# Patient Record
Sex: Female | Born: 1975 | Race: White | Hispanic: No | Marital: Married | Smoking: Never smoker
Health system: Southern US, Community
[De-identification: ages and names within clinical notes are randomized; demographics above are authoritative.]

## PROBLEM LIST (undated history)

## (undated) DIAGNOSIS — M542 Cervicalgia: Secondary | ICD-10-CM

## (undated) DIAGNOSIS — I10 Essential (primary) hypertension: Secondary | ICD-10-CM

## (undated) DIAGNOSIS — E079 Disorder of thyroid, unspecified: Secondary | ICD-10-CM

## (undated) DIAGNOSIS — M549 Dorsalgia, unspecified: Secondary | ICD-10-CM

## (undated) DIAGNOSIS — M25519 Pain in unspecified shoulder: Secondary | ICD-10-CM

## (undated) DIAGNOSIS — J302 Other seasonal allergic rhinitis: Secondary | ICD-10-CM

## (undated) HISTORY — PX: ABDOMINAL HYSTERECTOMY: SHX81

## (undated) HISTORY — PX: ABDOMINAL SURGERY: SHX537

## (undated) HISTORY — PX: OTHER SURGICAL HISTORY: SHX169

## (undated) HISTORY — PX: BREAST SURGERY: SHX581

## (undated) HISTORY — PX: HAND SURGERY: SHX662

## (undated) HISTORY — PX: TONSILLECTOMY: SUR1361

---

## 2000-10-26 ENCOUNTER — Other Ambulatory Visit: Admission: RE | Admit: 2000-10-26 | Discharge: 2000-10-26 | Payer: Self-pay | Admitting: Plastic Surgery

## 2000-12-07 ENCOUNTER — Other Ambulatory Visit: Admission: RE | Admit: 2000-12-07 | Discharge: 2000-12-07 | Payer: Self-pay | Admitting: Gynecology

## 2001-02-10 ENCOUNTER — Encounter: Admission: RE | Admit: 2001-02-10 | Discharge: 2001-02-10 | Payer: Self-pay | Admitting: Otolaryngology

## 2001-02-10 ENCOUNTER — Encounter: Payer: Self-pay | Admitting: Otolaryngology

## 2001-03-15 ENCOUNTER — Other Ambulatory Visit: Admission: RE | Admit: 2001-03-15 | Discharge: 2001-03-15 | Payer: Self-pay | Admitting: Otolaryngology

## 2001-04-25 ENCOUNTER — Emergency Department (HOSPITAL_COMMUNITY): Admission: EM | Admit: 2001-04-25 | Discharge: 2001-04-26 | Payer: Self-pay | Admitting: Unknown Physician Specialty

## 2001-04-26 ENCOUNTER — Encounter: Payer: Self-pay | Admitting: Emergency Medicine

## 2001-05-04 ENCOUNTER — Encounter: Admission: RE | Admit: 2001-05-04 | Discharge: 2001-05-19 | Payer: Self-pay | Admitting: Internal Medicine

## 2001-05-20 ENCOUNTER — Encounter: Admission: RE | Admit: 2001-05-20 | Discharge: 2001-08-18 | Payer: Self-pay | Admitting: Internal Medicine

## 2003-12-05 ENCOUNTER — Inpatient Hospital Stay (HOSPITAL_COMMUNITY): Admission: AD | Admit: 2003-12-05 | Discharge: 2003-12-05 | Payer: Self-pay | Admitting: Obstetrics & Gynecology

## 2013-08-29 DIAGNOSIS — M797 Fibromyalgia: Secondary | ICD-10-CM | POA: Diagnosis present

## 2013-11-02 ENCOUNTER — Ambulatory Visit: Payer: Self-pay | Admitting: Family Medicine

## 2013-12-27 ENCOUNTER — Ambulatory Visit: Payer: Self-pay | Admitting: Family Medicine

## 2014-03-07 ENCOUNTER — Ambulatory Visit: Payer: Self-pay | Admitting: Emergency Medicine

## 2014-03-28 DIAGNOSIS — E890 Postprocedural hypothyroidism: Secondary | ICD-10-CM | POA: Diagnosis present

## 2014-07-24 ENCOUNTER — Ambulatory Visit: Payer: Self-pay

## 2014-07-31 DIAGNOSIS — Z9013 Acquired absence of bilateral breasts and nipples: Secondary | ICD-10-CM | POA: Insufficient documentation

## 2015-05-19 IMAGING — CR CERVICAL SPINE - COMPLETE 4+ VIEW
1 series · 5 of 5 positions shown · non-contrast
Comparison: None.

CLINICAL DATA: Pain post blunt trauma

EXAM:
CERVICAL SPINE  4+ VIEWS

[Series 1: lat · 0.17mm/px · 5 of 5 slices shown]
[im 1/5]
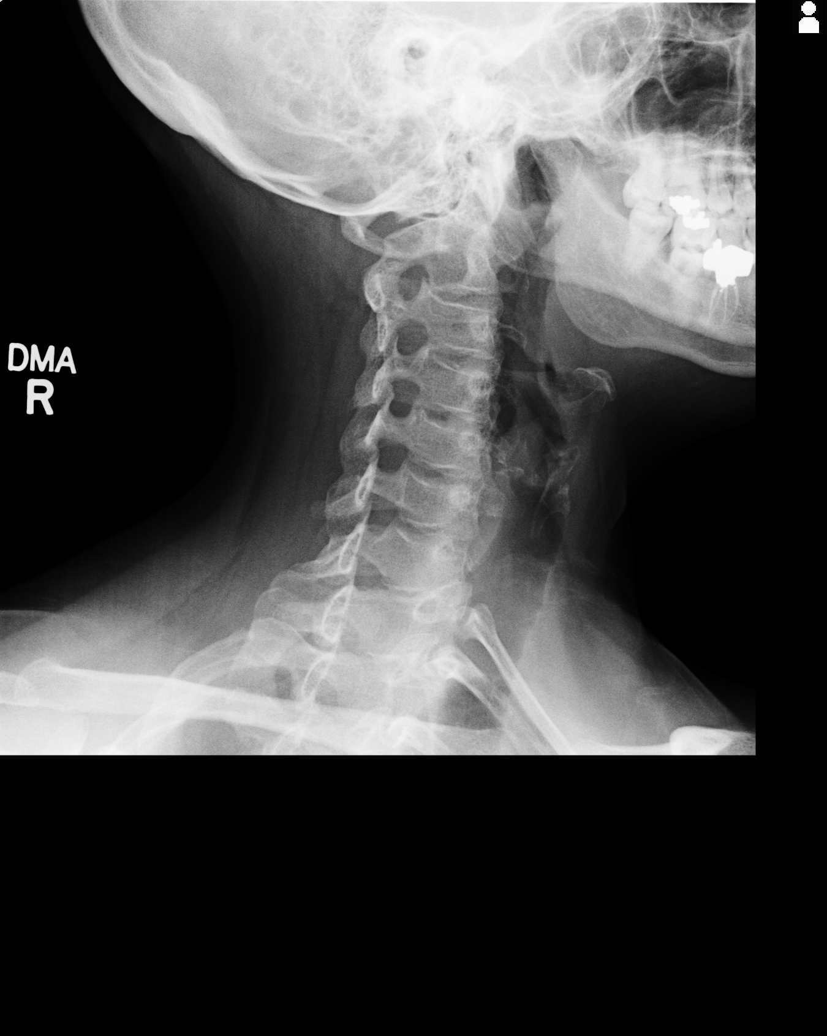
[im 2/5]
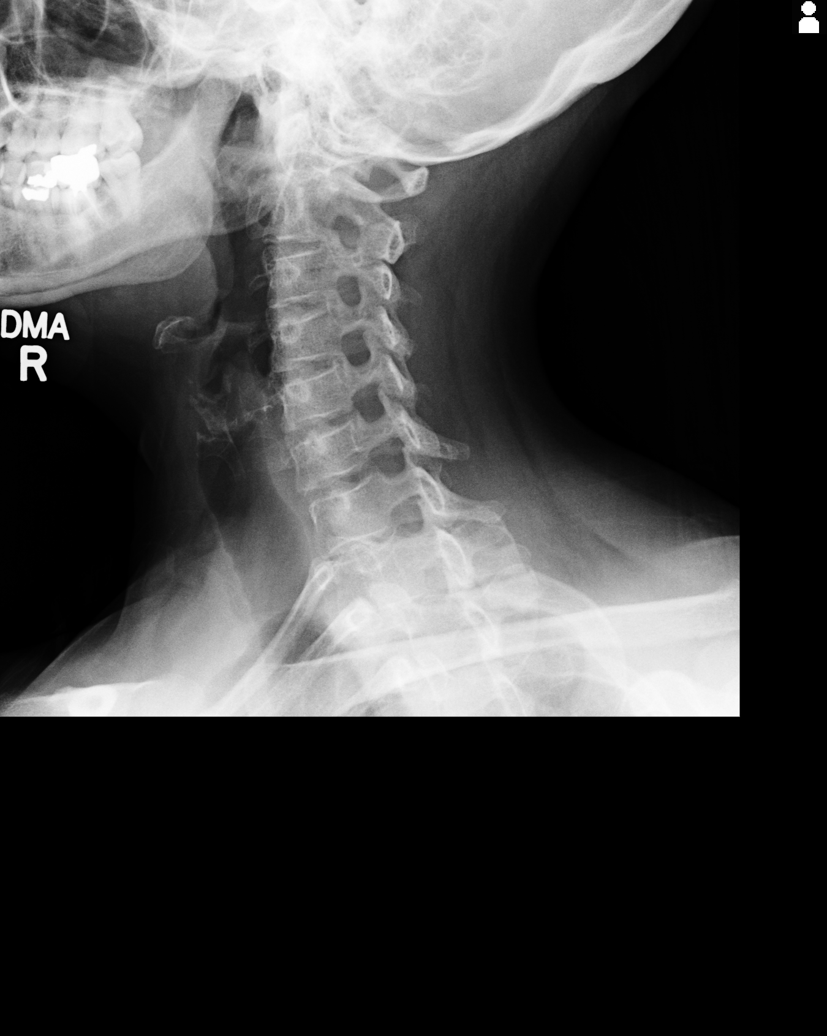
[im 3/5]
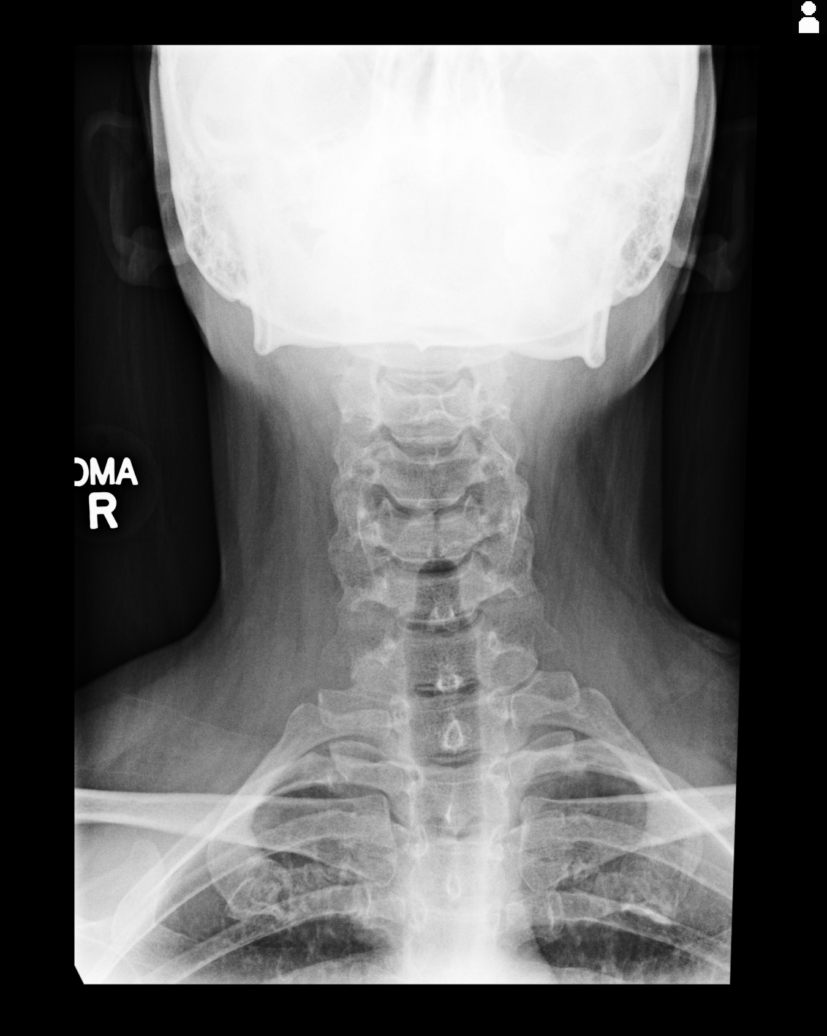
[im 4/5]
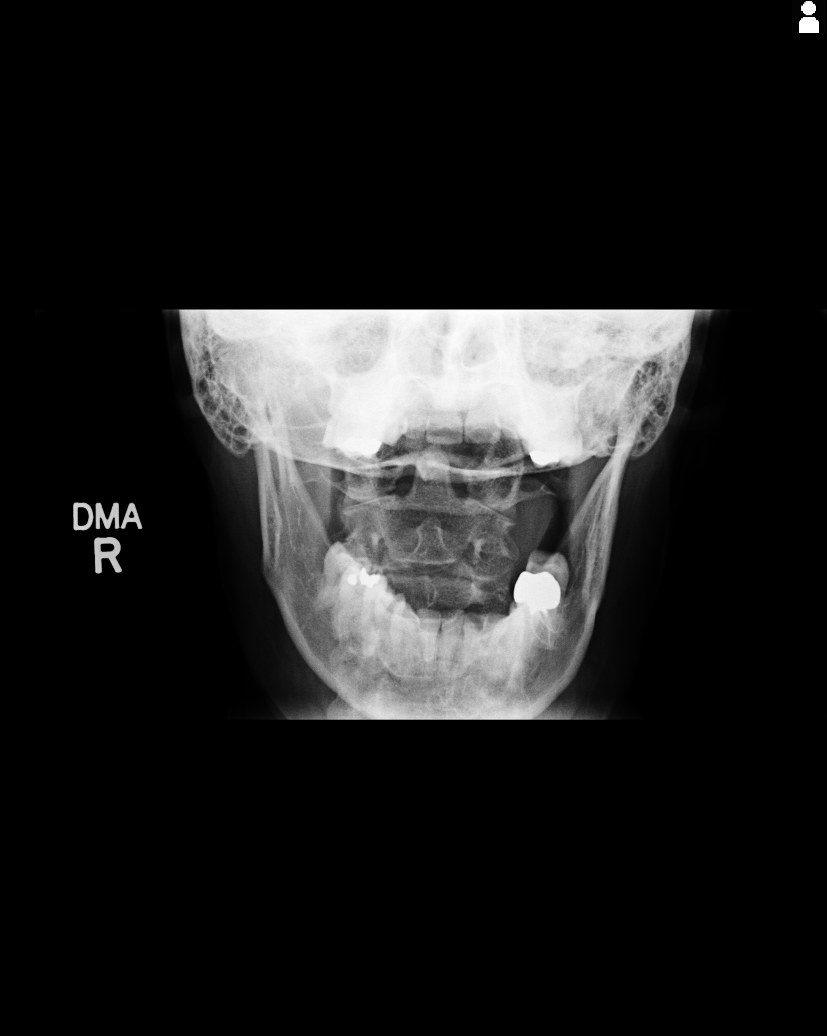
[im 5/5]
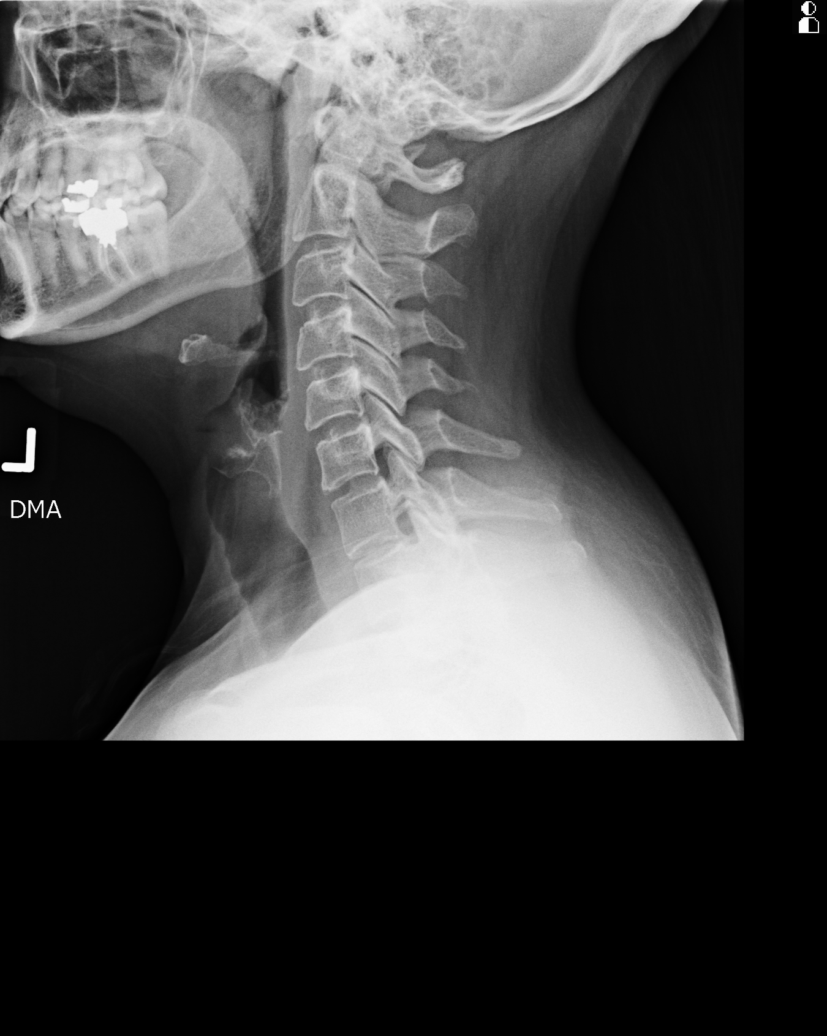

[5 of 5 positions shown; findings below may reference images not displayed]

FINDINGS: There is no evidence of cervical spine fracture or prevertebral soft
tissue swelling. Alignment is normal. No other significant bone
abnormalities are identified. Multiple dental restorations.
IMPRESSION: Negative cervical spine radiographs.

## 2015-05-19 IMAGING — CR DG THORACIC SPINE 2-3V
1 series · 3 of 3 positions shown · non-contrast
Comparison: None.

CLINICAL DATA: Pain post blunt trauma

EXAM:
THORACIC SPINE - 2 VIEW

[Series 1: ap · 0.17mm/px · 3 of 3 slices shown]
[im 1/3]
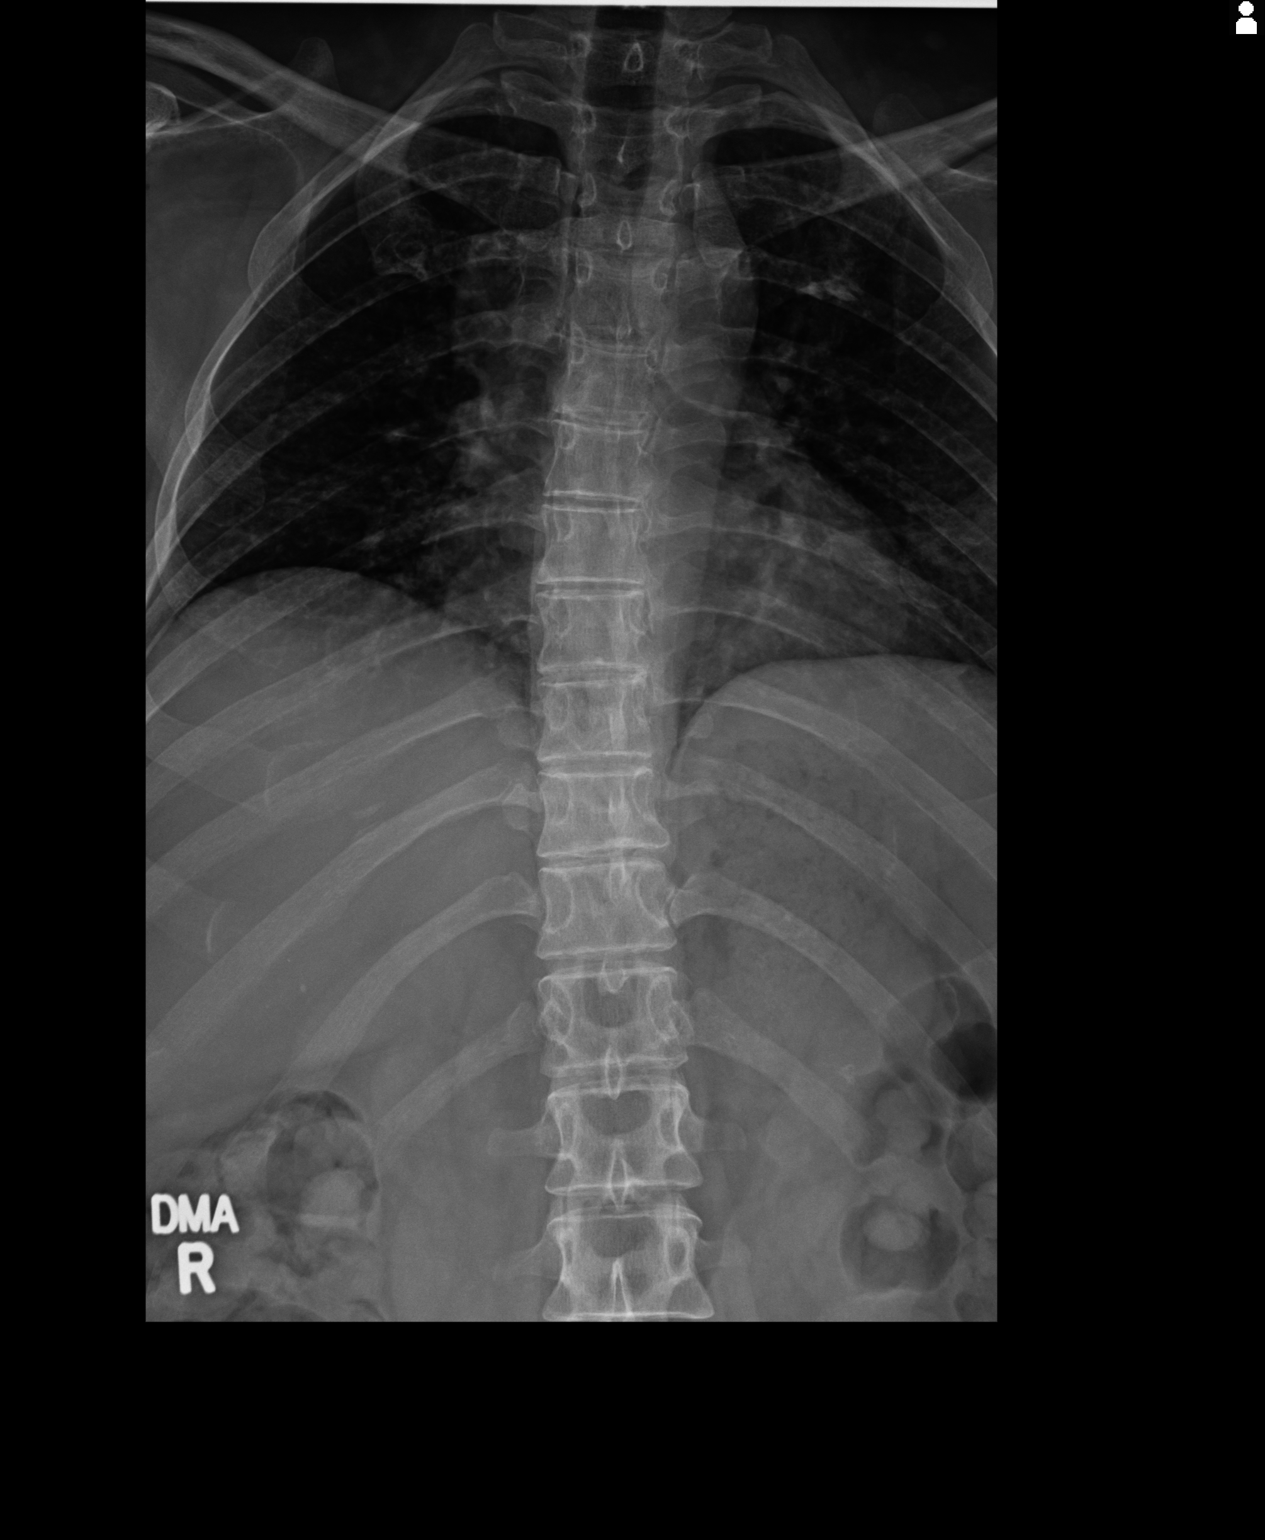
[im 2/3]
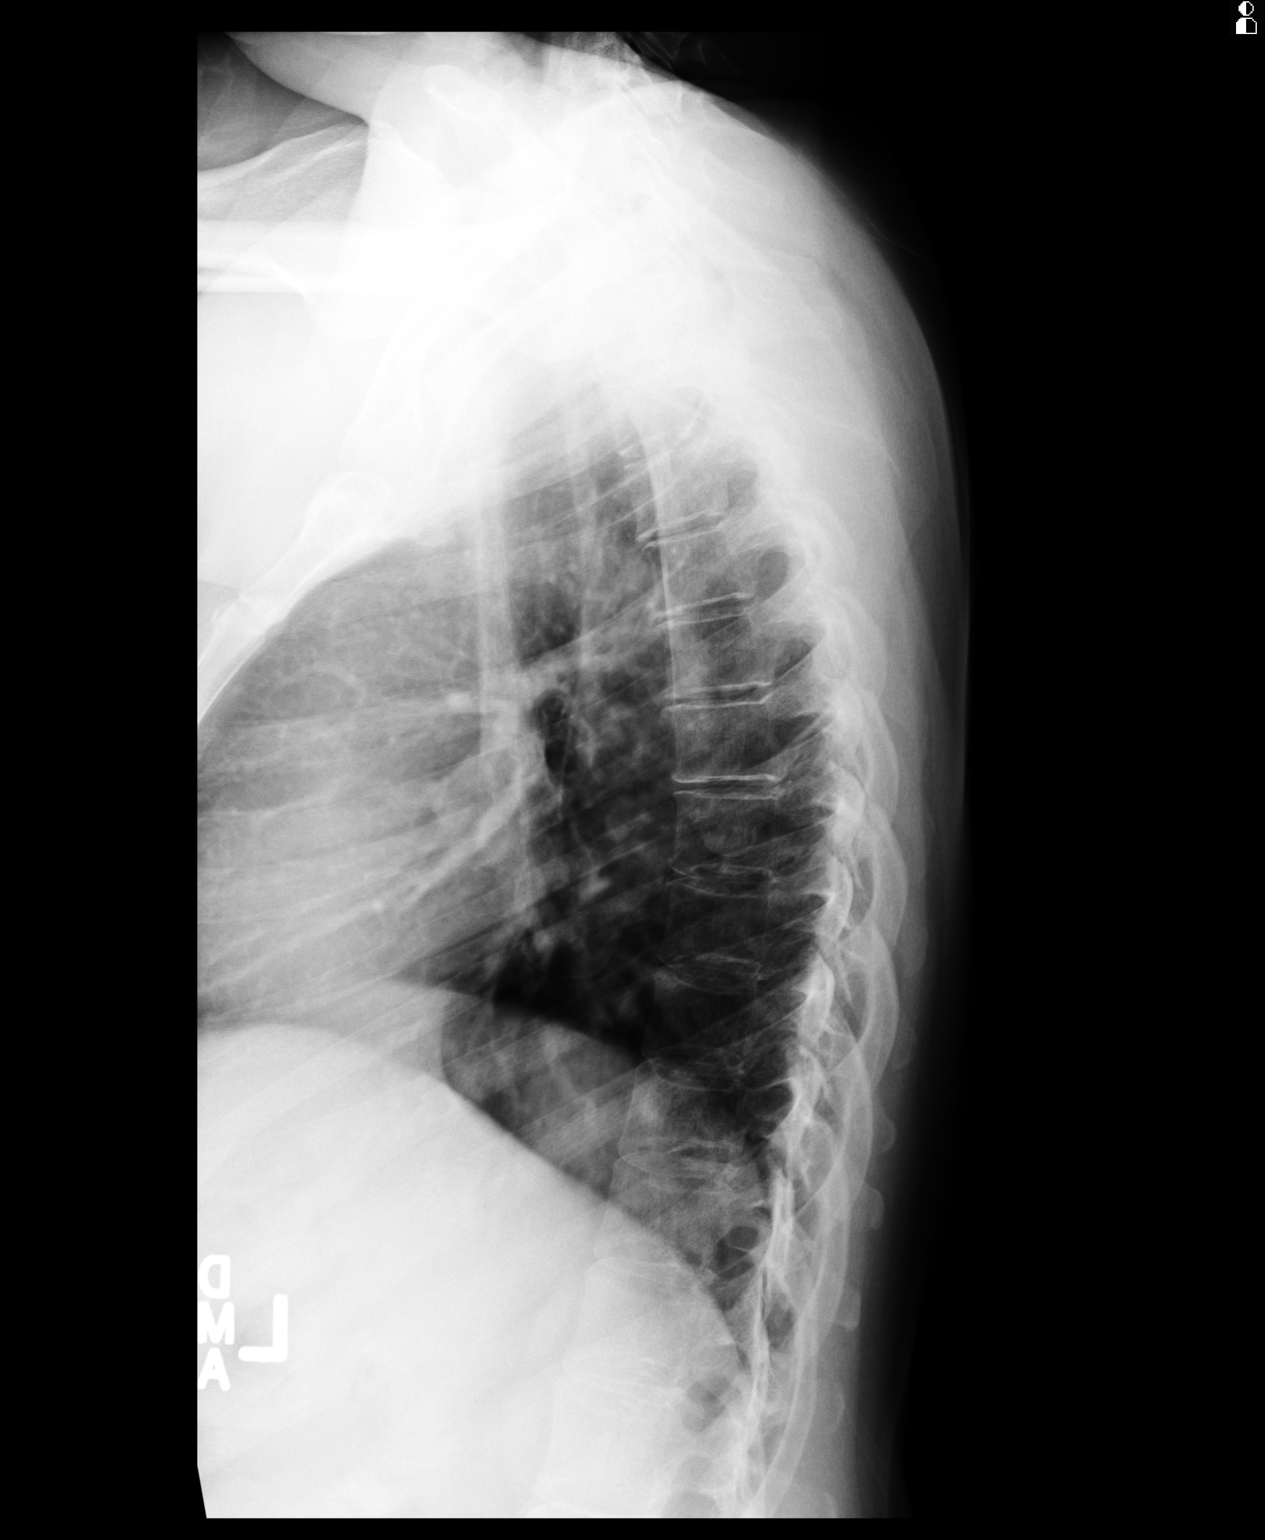
[im 3/3]
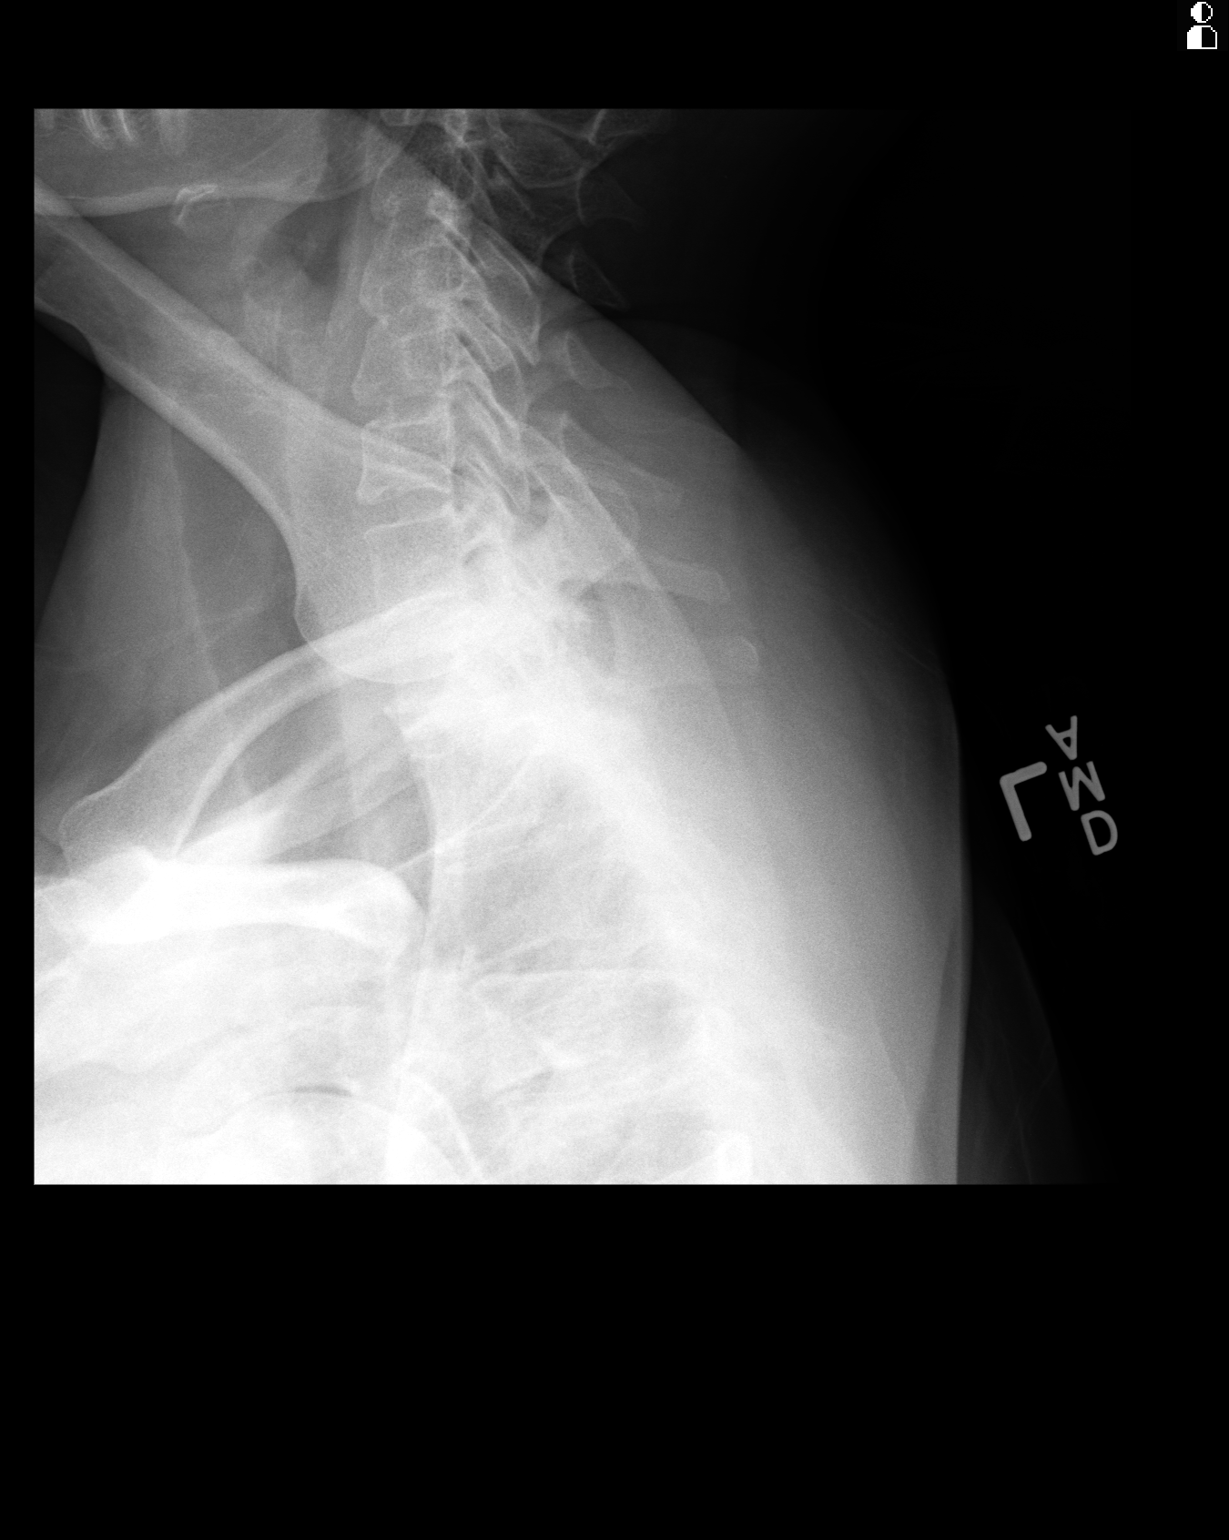

[3 of 3 positions shown; findings below may reference images not displayed]

FINDINGS: There is no evidence of thoracic spine fracture. Alignment is
normal. No other significant bone abnormalities are identified.
IMPRESSION: Negative.

## 2015-05-19 IMAGING — CR DG LUMBAR SPINE 2-3V
1 series · 3 of 3 positions shown · non-contrast
Comparison: None.

CLINICAL DATA: Blunt trauma

EXAM:
LUMBAR SPINE - 2-3 VIEW

[Series 1: ap · 0.17mm/px · 3 of 3 slices shown]
[im 1/3]
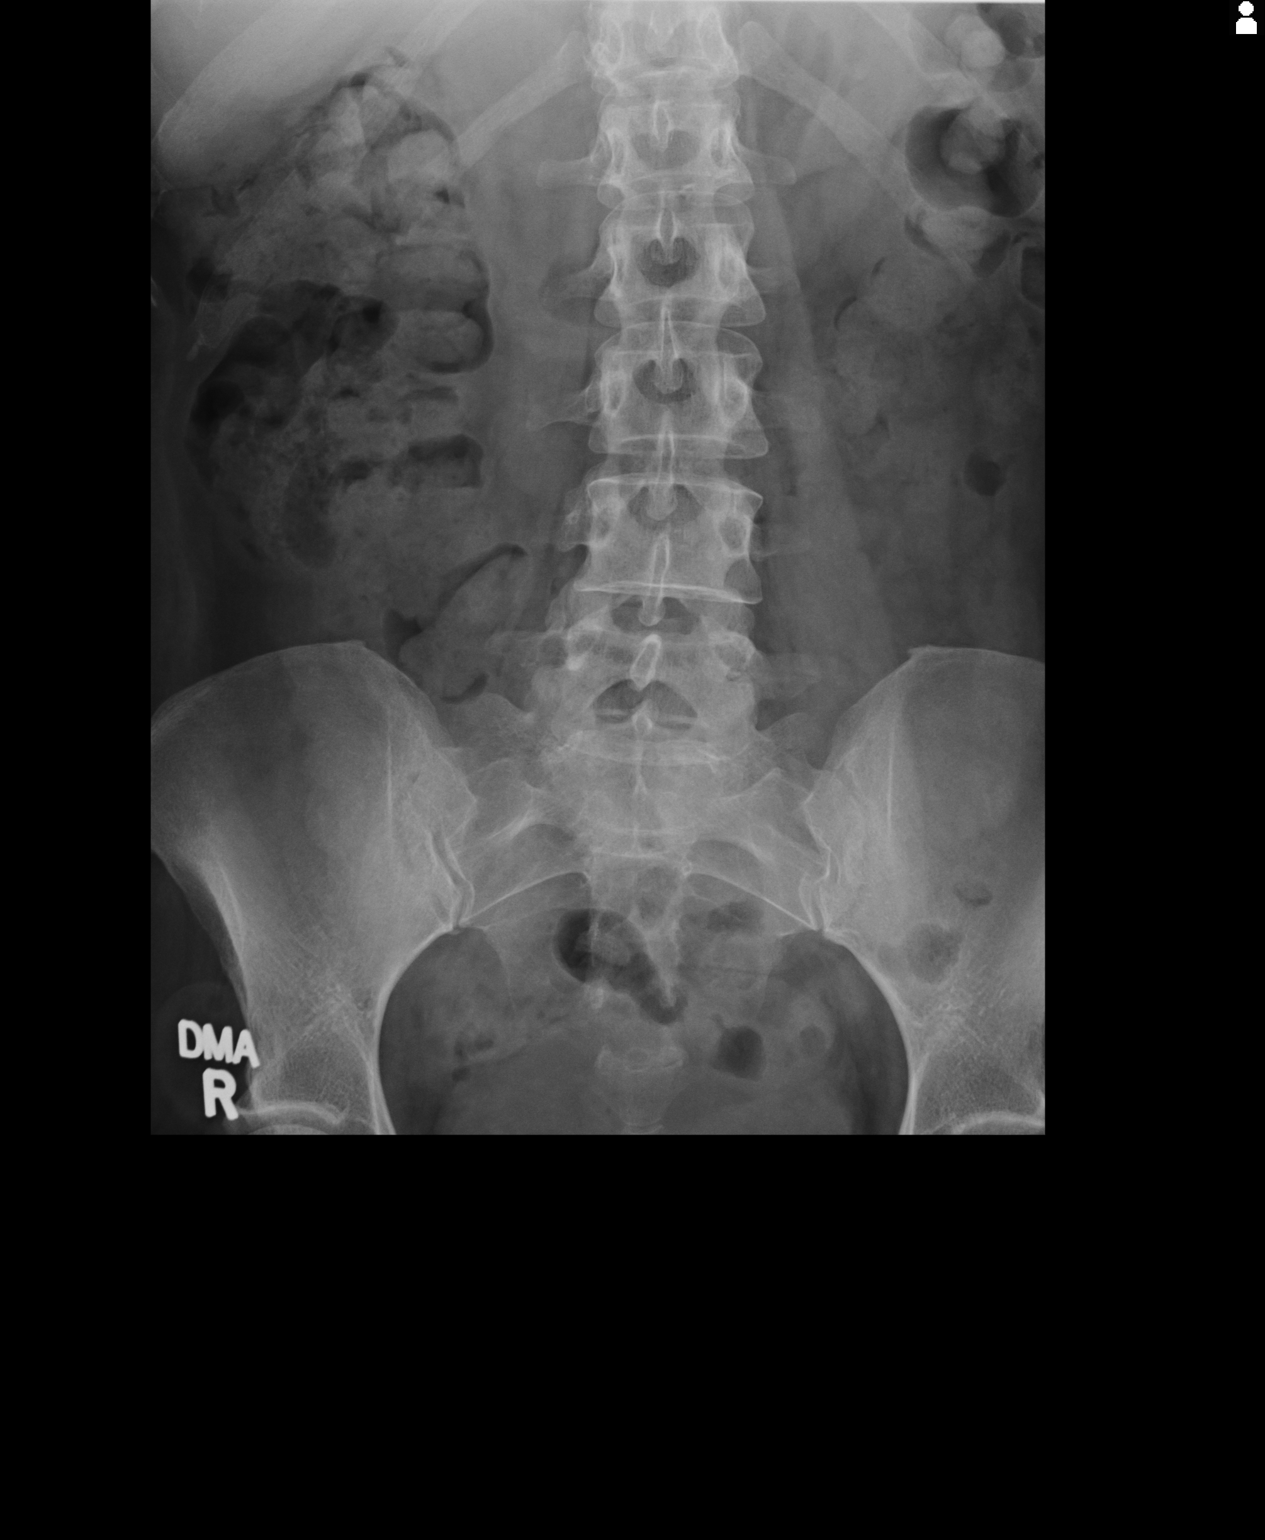
[im 2/3]
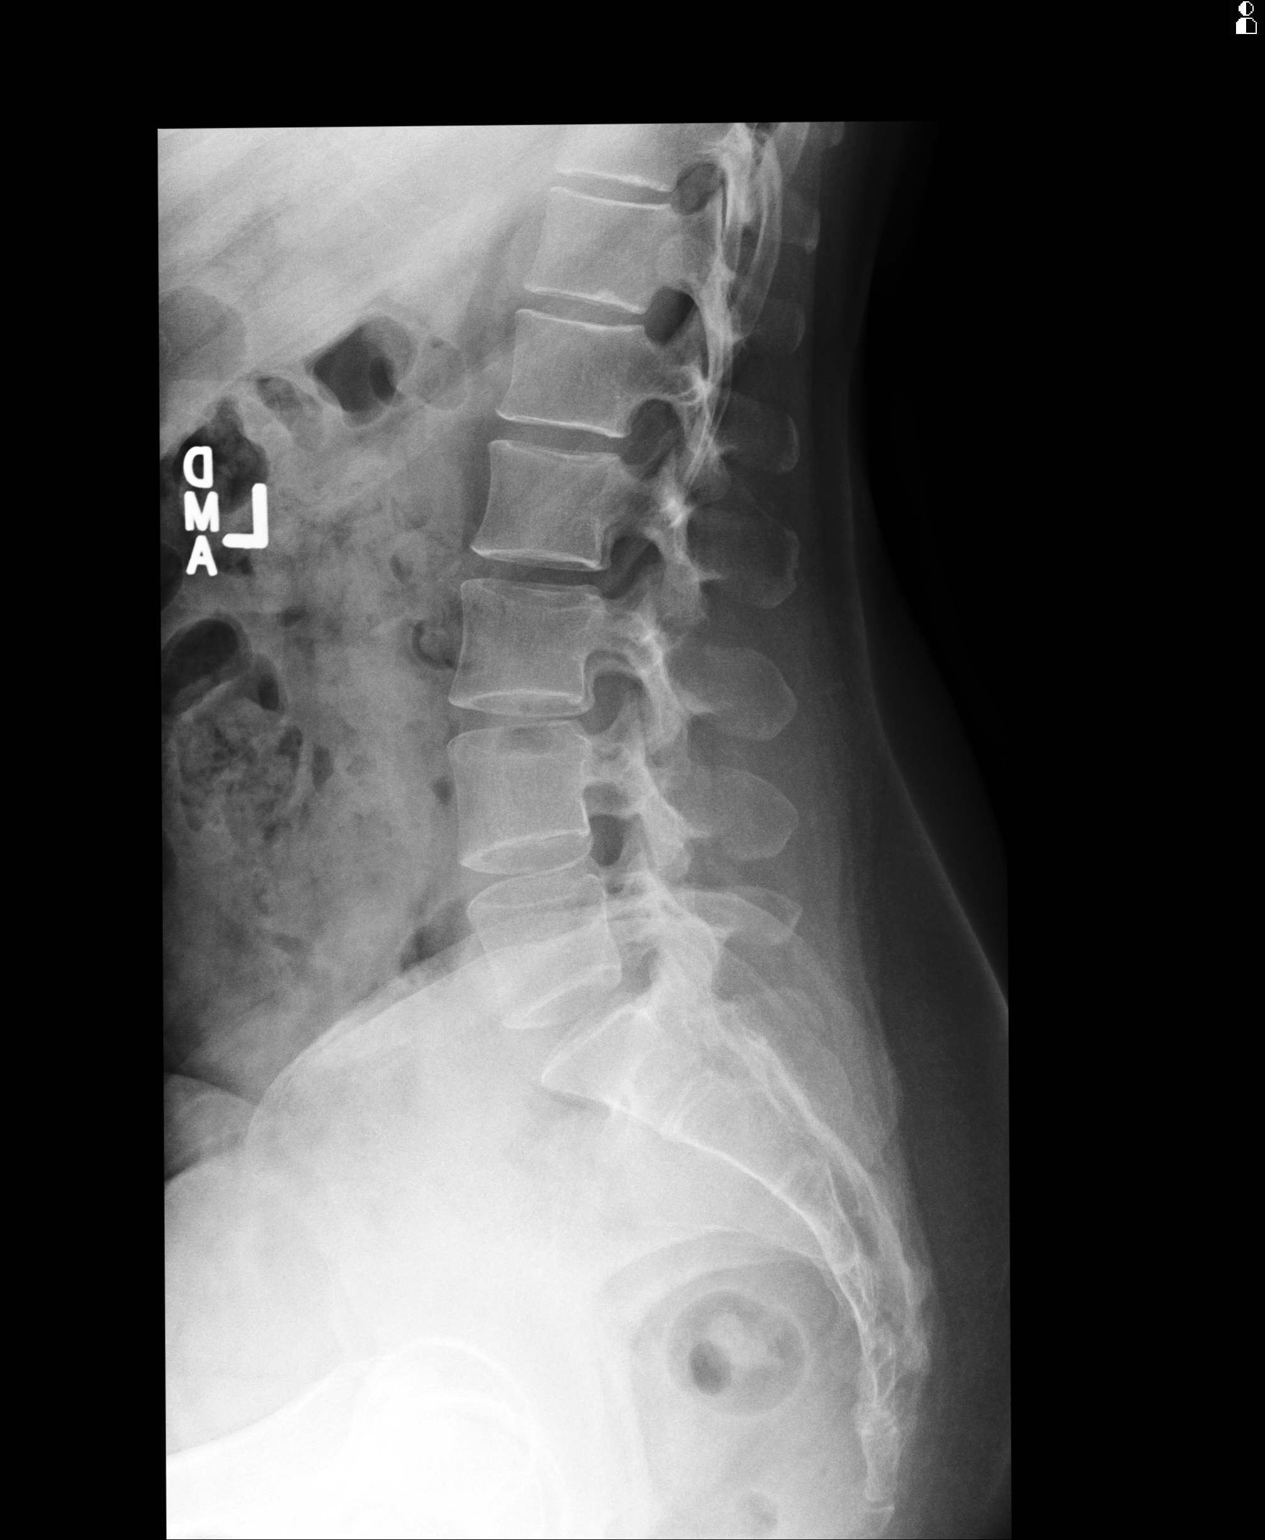
[im 3/3]
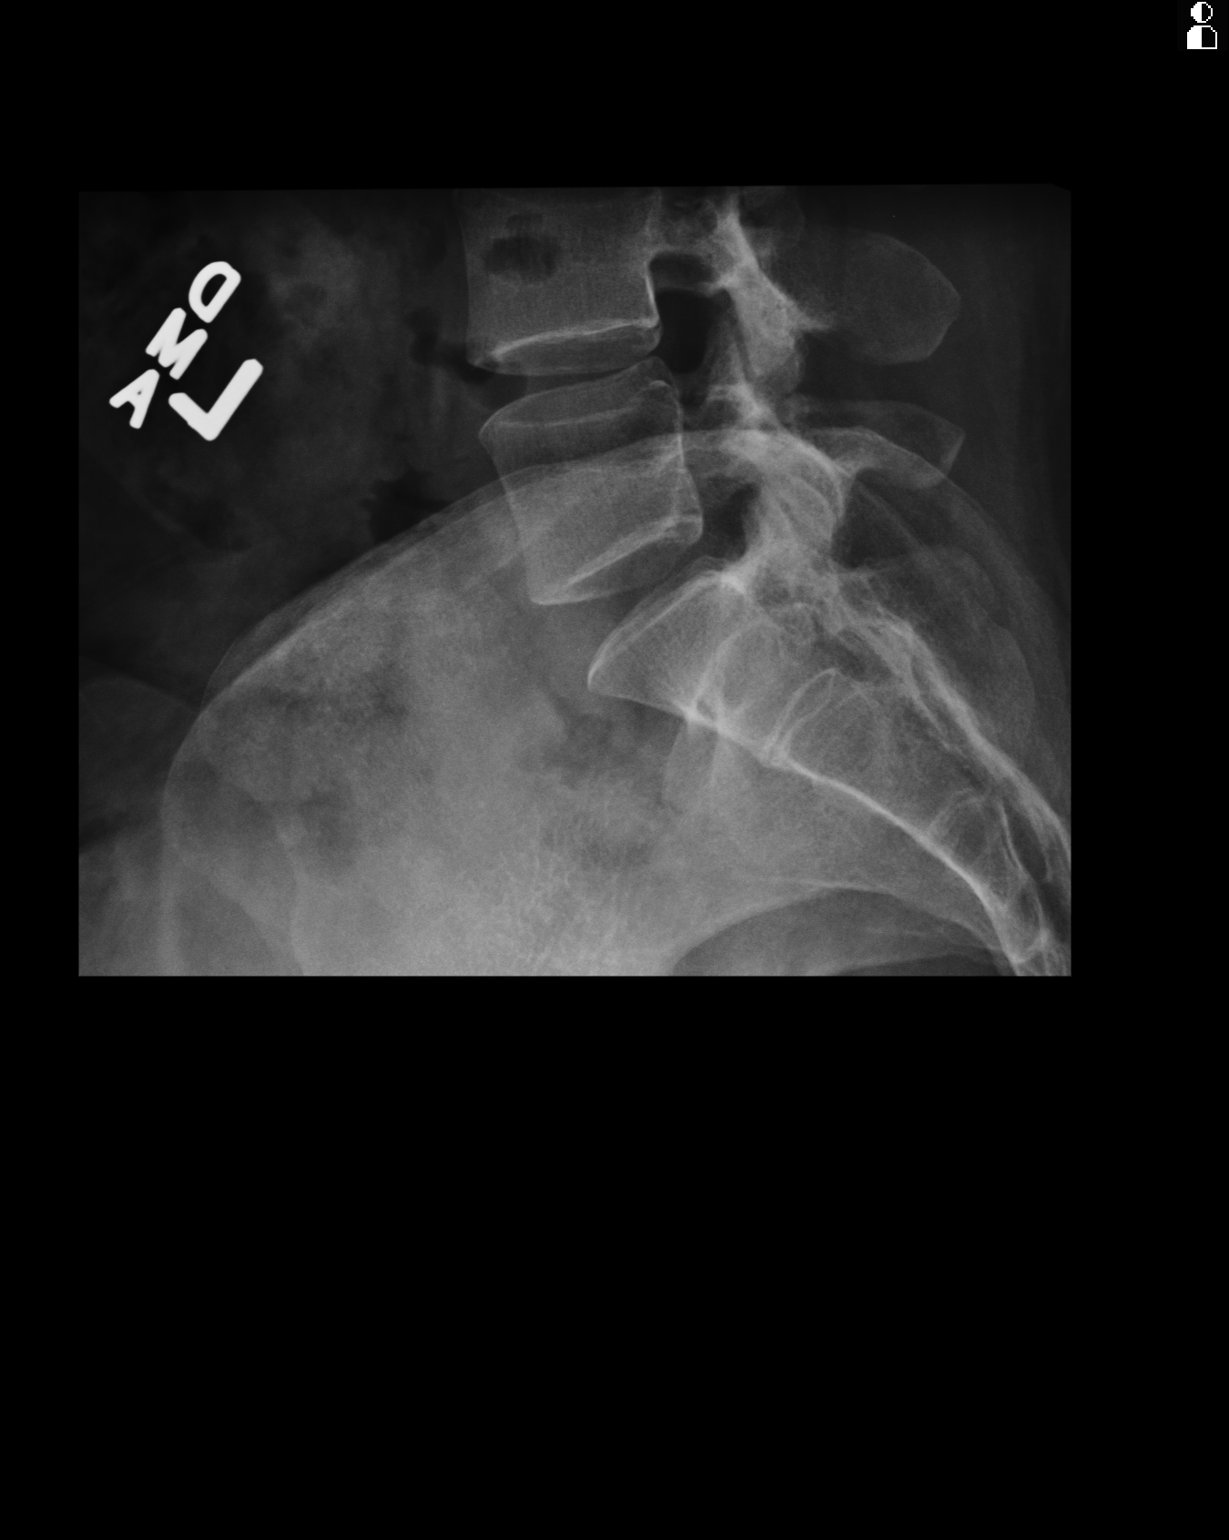

[3 of 3 positions shown; findings below may reference images not displayed]

FINDINGS: There is no evidence of lumbar spine fracture. Alignment is normal.
Intervertebral disc spaces are maintained.
IMPRESSION: Negative.

## 2015-05-24 DIAGNOSIS — E876 Hypokalemia: Secondary | ICD-10-CM | POA: Diagnosis present

## 2015-07-05 DIAGNOSIS — I10 Essential (primary) hypertension: Secondary | ICD-10-CM | POA: Diagnosis present

## 2015-12-20 ENCOUNTER — Ambulatory Visit: Payer: Medicaid Other

## 2015-12-20 ENCOUNTER — Encounter: Payer: Self-pay | Admitting: Emergency Medicine

## 2015-12-20 ENCOUNTER — Ambulatory Visit
Admission: EM | Admit: 2015-12-20 | Discharge: 2015-12-20 | Disposition: A | Discharge status: Home / Self Care | Payer: Medicaid Other | Attending: Family Medicine | Admitting: Family Medicine

## 2015-12-20 DIAGNOSIS — M19079 Primary osteoarthritis, unspecified ankle and foot: Secondary | ICD-10-CM | POA: Diagnosis not present

## 2015-12-20 DIAGNOSIS — M79674 Pain in right toe(s): Secondary | ICD-10-CM | POA: Diagnosis not present

## 2015-12-20 DIAGNOSIS — M19071 Primary osteoarthritis, right ankle and foot: Secondary | ICD-10-CM | POA: Diagnosis not present

## 2015-12-20 DIAGNOSIS — M79675 Pain in left toe(s): Secondary | ICD-10-CM | POA: Insufficient documentation

## 2015-12-20 HISTORY — DX: Essential (primary) hypertension: I10

## 2015-12-20 HISTORY — DX: Disorder of thyroid, unspecified: E07.9

## 2015-12-20 HISTORY — DX: Cervicalgia: M54.2

## 2015-12-20 HISTORY — DX: Pain in unspecified shoulder: M25.519

## 2015-12-20 HISTORY — DX: Other seasonal allergic rhinitis: J30.2

## 2015-12-20 HISTORY — DX: Dorsalgia, unspecified: M54.9

## 2015-12-20 MED ORDER — MELOXICAM 15 MG PO TABS: 15.0000 mg | ORAL_TABLET | Freq: Every day | ORAL | Status: DC

## 2015-12-20 NOTE — ED Provider Notes (Signed)
CSN: [id number]     Arrival date & time 12/20/15  1819 History   First MD Initiated Contact with Patient 12/20/15 1939    Nurses notes were reviewed.  Chief Complaint  Patient presents with  . Toe Pain   Patient has had pain in the left toe years. She states that she that the toe backwards years ago she is to ice the toe and foot. She states the last month until his increasing cause her more pain. She saw her PCM who thought that she should use ice and alternate heat for the pain in her foot and toe. She states that now the toe is keeping up at night. She gives a history of being able to tolerate great pains with a hysterectomy both breasts been removed and an intolerance to narcotics and she was upset by that but the she was able to sleep but those surgeries she can't sleep his foot pain. She denies any recent injury she seen for.before but wasn't happy with the care that she received. She does have a boot at home. She doesn't report any new trauma to the foot and toe but she reports pain scanning progressively worse.  While she does not smoke this strong history of breast cancer in the family she is warned of having prophylactic bilateral breast removal/mastectomy and hysterectomy as well. She's developed osteopenia but not osteoporosis but cannot take any type of supplemental estrogen treatment either. She has multiple allergies as well. She never smoked.   (Consider location/radiation/quality/duration/timing/severity/associated sxs/prior Treatment) HPI  Past Medical History  Diagnosis Date  . Hypertension   . Seasonal allergies   . Thyroid disease   . Back pain   . Neck pain   . Shoulder pain    Past Surgical History  Procedure Laterality Date  . Breast surgery    . Abdominal hysterectomy    . Nose repair    . Tonsillectomy    . Hand surgery      5 surgeries on right hand 2 on left hand  . Abdominal surgery     History reviewed. No pertinent family history. Social History    Substance Use Topics  . Smoking status: Never Smoker   . Smokeless tobacco: Never Used  . Alcohol Use: No   OB History    No data available     Review of Systems  All other systems reviewed and are negative.   Allergies  Codeine; Darvon; Dilaudid; Doxycycline; Keflex; Morphine and related; Percocet; and Vicodin  Home Medications   Prior to Admission medications   Medication Sig Start Date End Date Taking? Authorizing Provider  amLODipine (NORVASC) 10 MG tablet Take 10 mg by mouth daily.   Yes Historical Provider, MD  cholecalciferol (VITAMIN D) 1000 units tablet Take 1,000 Units by mouth daily.   Yes Historical Provider, MD  cyclobenzaprine (FLEXERIL) 10 MG tablet Take 10 mg by mouth 3 (three) times daily as needed for muscle spasms.   Yes Historical Provider, MD  hydrochlorothiazide (HYDRODIURIL) 25 MG tablet Take 25 mg by mouth daily.   Yes Historical Provider, MD  levothyroxine (SYNTHROID, LEVOTHROID) 100 MCG tablet Take 100 mcg by mouth daily before breakfast.   Yes Historical Provider, MD  lisinopril (PRINIVIL,ZESTRIL) 10 MG tablet Take 10 mg by mouth daily.   Yes Historical Provider, MD  Melatonin 10 MG TABS Take 1 tablet by mouth.   Yes Historical Provider, MD  metaxalone (SKELAXIN) 800 MG tablet Take 800 mg by mouth 3 (three) times daily.  Yes Historical Provider, MD  methocarbamol (ROBAXIN) 750 MG tablet Take 750 mg by mouth 4 (four) times daily.   Yes Historical Provider, MD  montelukast (SINGULAIR) 10 MG tablet Take 10 mg by mouth at bedtime.   Yes Historical Provider, MD  potassium chloride SA (K-DUR,KLOR-CON) 20 MEQ tablet Take 20 mEq by mouth 2 (two) times daily.   Yes Historical Provider, MD  meloxicam (MOBIC) 15 MG tablet Take 1 tablet (15 mg total) by mouth daily. 12/20/15   Hassan Rowan, MD   Meds Ordered and Administered this Visit  Medications - No data to display  BP 112/81 mmHg  Pulse 50  Temp(Src) 97.7 F (36.5 C) (Tympanic)  Resp 16  Ht  (1.499  m)  Wt 150 lb (68.04 kg)  BMI 30.28 kg/m2  SpO2 100% No data found.   Physical Exam  Constitutional: She is oriented to person, place, and time. She appears well-developed and well-nourished.  Non-toxic appearance. She does not have a sickly appearance. She appears ill.  HENT:  Head: Normocephalic and atraumatic.  Eyes: Conjunctivae are normal. Pupils are equal, round, and reactive to light.  Musculoskeletal: Normal range of motion. She exhibits tenderness.       Feet:  Neurological: She is alert and oriented to person, place, and time. No cranial nerve deficit.  Skin: Skin is warm and dry.  Psychiatric: She has a normal mood and affect. Her speech is normal and behavior is normal.  Vitals reviewed.   ED Course  Procedures (including critical care time)  Labs Review Labs Reviewed - No data to display  Imaging Review Dg Toe Great Left  12/20/2015  CLINICAL DATA:  Left great toe pain for 4 weeks. EXAM: LEFT GREAT TOE COMPARISON:  12/27/2013 FINDINGS: There is no evidence of acute fracture, subluxation or dislocation. The interphalangeal joint is unremarkable. Mild degenerative changes at the first MTP joint again noted. IMPRESSION: No acute abnormality. Mild degenerative changes of the first MTP joint. Electronically Signed   By: Harmon Pier M.D.   On: 12/20/2015 19:51     Visual Acuity Review  Right Eye Distance:   Left Eye Distance:   Bilateral Distance:    Right Eye Near:   Left Eye Near:    Bilateral Near:         MDM   1. Great toe pain, right   2. Primary osteoarthritis of right foot    Patient with pain in the right great toe to Mobic for Motrin 50 mg 1 tablet a day and if that doesn't help another option is to see a podiatrist for reevaluation. Patient is unable tolerate Toradol worse and insufficient for pain and another option is that she has a boot at home and she could also wear the boot and see if that helps.  Note: This dictation was prepared with  Dragon dictation along with smaller phrase technology. Any transcriptional errors that result from this process are unintentional.  Hassan Rowan, MD 12/20/15 2021

## 2015-12-20 NOTE — ED Notes (Addendum)
Having left great toe pain for 4 days. Patient stated pain is keeping her up at night

## 2015-12-20 NOTE — Discharge Instructions (Signed)
Cryotherapy Cryotherapy is when you put ice on your injury. Ice helps lessen pain and puffiness (swelling) after an injury. Ice works the best when you start using it in the first 24 to 48 hours after an injury. HOME CARE  Put a dry or damp towel between the ice pack and your skin.  You may press gently on the ice pack.  Leave the ice on for no more than 10 to 20 minutes at a time.  Check your skin after 5 minutes to make sure your skin is okay.  Rest at least 20 minutes between ice pack uses.  Stop using ice when your skin loses feeling (numbness).  Do not use ice on someone who cannot tell you when it hurts. This includes small children and people with memory problems (dementia). GET HELP RIGHT AWAY IF:  You have white spots on your skin.  Your skin turns blue or pale.  Your skin feels waxy or hard.  Your puffiness gets worse. MAKE SURE YOU:   Understand these instructions.  Will watch your condition.  Will get help right away if you are not doing well or get worse.   This information is not intended to replace advice given to you by your health care provider. Make sure you discuss any questions you have with your health care provider.   Document Released: 03/24/2008 Document Revised: 12/29/2011 Document Reviewed: 05/29/2011 Elsevier Interactive Patient Education 2016 Else[employer] Inc[employer]  Musculoskeletal Pain Musculoskeletal pain is muscle and boney aches and pains. These pains can occur in any part of the body. Your caregiver may treat you without knowing the cause of the pain. They may treat you if blood or urine tests, X-rays, and other tests were normal.  CAUSES There is often not a definite cause or reason for these pains. These pains may be caused by a type of germ (virus). The discomfort may also come from overuse. Overuse includes working out too hard when your body is not fit. Boney aches also come from weather changes. Bone is sensitive to atmospheric pressure  changes. HOME CARE INSTRUCTIONS   Ask when your test results will be ready. Make sure you get your test results.  Only take over-the-counter or prescription medicines for pain, discomfort, or fever as directed by your caregiver. If you were given medications for your condition, do not drive, operate machinery or power tools, or sign legal documents for 24 hours. Do not drink alcohol. Do not take sleeping pills or other medications that may interfere with treatment.  Continue all activities unless the activities cause more pain. When the pain lessens, slowly resume normal activities. Gradually increase the intensity and duration of the activities or exercise.  During periods of severe pain, bed rest may be helpful. Lay or sit in any position that is comfortable.  Putting ice on the injured area.  Put ice in a bag.  Place a towel between your skin and the bag.  Leave the ice on for 15 to 20 minutes, 3 to 4 times a day.  Follow up with your caregiver for continued problems and no reason can be found for the pain. If the pain becomes worse or does not go away, it may be necessary to repeat tests or do additional testing. Your caregiver may need to look further for a possible cause. SEEK IMMEDIATE MEDICAL CARE IF:  You have pain that is getting worse and is not relieved by medications.  You develop chest pain that is associated with shortness or  breath, sweating, feeling sick to your stomach (nauseous), or throw up (vomit).  Your pain becomes localized to the abdomen.  You develop any new symptoms that seem different or that concern you. MAKE SURE YOU:   Understand these instructions.  Will watch your condition.  Will get help right away if you are not doing well or get worse.   This information is not intended to replace advice given to you by your health care provider. Make sure you discuss any questions you have with your health care provider.   Document Released: 10/06/2005  Document Revised: 12/29/2011 Document Reviewed: 06/10/2013 Elsevier Interactive Patient Education 2016 Else[employer] Inc.   Osteoarthritis Osteoarthritis is a disease that causes soreness and inflammation of a joint. It occurs when the cartilage at the affected joint wears down. Cartilage acts as a cushion, covering the ends of bones where they meet to form a joint. Osteoarthritis is the most common form of arthritis. It often occurs in older people. The joints affected most often by this condition include those in the:  Ends of the fingers.  Thumbs.  Neck.  Lower back.  Knees.  Hips. CAUSES  Over time, the cartilage that covers the ends of bones begins to wear away. This causes bone to rub on bone, producing pain and stiffness in the affected joints.  RISK FACTORS Certain factors can increase your chances of having osteoarthritis, including:  Older age.  Excessive body weight.  Overuse of joints.  Previous joint injury. SIGNS AND SYMPTOMS   Pain, swelling, and stiffness in the joint.  Over time, the joint may lose its normal shape.  Small deposits of bone (osteophytes) may grow on the edges of the joint.  Bits of bone or cartilage can break off and float inside the joint space. This may cause more pain and damage. DIAGNOSIS  Your health care provider will do a physical exam and ask about your symptoms. Various tests may be ordered, such as:  X-rays of the affected joint.  Blood tests to rule out other types of arthritis. Additional tests may be used to diagnose your condition. TREATMENT  Goals of treatment are to control pain and improve joint function. Treatment plans may include:  A prescribed exercise program that allows for rest and joint relief.  A weight control plan.  Pain relief techniques, such as:  Properly applied heat and cold.  Electric pulses delivered to nerve endings under the skin (transcutaneous electrical nerve stimulation  [TENS]).  Massage.  Certain nutritional supplements.  Medicines to control pain, such as:  Acetaminophen.  Nonsteroidal anti-inflammatory drugs (NSAIDs), such as naproxen.  Narcotic or central-acting agents, such as tramadol.  Corticosteroids. These can be given orally or as an injection.  Surgery to reposition the bones and relieve pain (osteotomy) or to remove loose pieces of bone and cartilage. Joint replacement may be needed in advanced states of osteoarthritis. HOME CARE INSTRUCTIONS   Take medicines only as directed by your health care provider.  Maintain a healthy weight. Follow your health care provider's instructions for weight control. This may include dietary instructions.  Exercise as directed. Your health care provider can recommend specific types of exercise. These may include:  Strengthening exercises. These are done to strengthen the muscles that support joints affected by arthritis. They can be performed with weights or with exercise bands to add resistance.  Aerobic activities. These are exercises, such as brisk walking or low-impact aerobics, that get your heart pumping.  Range-of-motion activities. These keep your joints limber.  Balance and agility exercises. These help you maintain daily living skills.  Rest your affected joints as directed by your health care provider.  Keep all follow-up visits as directed by your health care provider. SEEK MEDICAL CARE IF:   Your skin turns red.  You develop a rash in addition to your joint pain.  You have worsening joint pain.  You have a fever along with joint or muscle aches. SEEK IMMEDIATE MEDICAL CARE IF:  You have a significant loss of weight or appetite.  You have night sweats. FOR MORE INFORMATION   National Institute of Arthritis and Musculoskeletal and Skin Diseases: www.niams.http://www.myers.net/  [employer] on Aging: https://walker.com/  American College of Rheumatology: www.rheumatology.org   This  information is not intended to replace advice given to you by your health care provider. Make sure you discuss any questions you have with your health care provider.   Document Released: 10/06/2005 Document Revised: 10/27/2014 Document Reviewed: 06/13/2013 Elsevier Interactive Patient Education [employer]

## 2016-04-28 ENCOUNTER — Ambulatory Visit: Payer: Medicaid Other

## 2016-04-28 ENCOUNTER — Ambulatory Visit
Admission: EM | Admit: 2016-04-28 | Discharge: 2016-04-28 | Disposition: A | Discharge status: Home / Self Care | Payer: Medicaid Other | Attending: Family Medicine | Admitting: Family Medicine

## 2016-04-28 ENCOUNTER — Encounter: Payer: Self-pay | Admitting: *Deleted

## 2016-04-28 DIAGNOSIS — E079 Disorder of thyroid, unspecified: Secondary | ICD-10-CM | POA: Diagnosis not present

## 2016-04-28 DIAGNOSIS — Z79899 Other long term (current) drug therapy: Secondary | ICD-10-CM | POA: Diagnosis not present

## 2016-04-28 DIAGNOSIS — X58XXXA Exposure to other specified factors, initial encounter: Secondary | ICD-10-CM | POA: Insufficient documentation

## 2016-04-28 DIAGNOSIS — S93602A Unspecified sprain of left foot, initial encounter: Secondary | ICD-10-CM | POA: Diagnosis not present

## 2016-04-28 DIAGNOSIS — S93692A Other sprain of left foot, initial encounter: Secondary | ICD-10-CM | POA: Insufficient documentation

## 2016-04-28 DIAGNOSIS — I10 Essential (primary) hypertension: Secondary | ICD-10-CM | POA: Insufficient documentation

## 2016-04-28 DIAGNOSIS — M79672 Pain in left foot: Secondary | ICD-10-CM | POA: Diagnosis present

## 2016-04-28 NOTE — ED Provider Notes (Signed)
CSN: [id number]     Arrival date & time 04/28/16  1835 History   First MD Initiated Contact with Patient 04/28/16 1954     Chief Complaint  Patient presents with  . Foot Problem   (Consider location/radiation/quality/duration/timing/severity/associated sxs/prior Treatment) HPI Comments: 40 yo female with a h/o peripheral neuropathy presents with c/o left foot swelling, bruising and mild discomfort. States not sure how she injured her foot but was bending, stooping, squatting frequently this past week while working in her yard. Does not recall any specific trauma.   The history is provided by the patient.    Past Medical History  Diagnosis Date  . Hypertension   . Seasonal allergies   . Thyroid disease   . Back pain   . Neck pain   . Shoulder pain    Past Surgical History  Procedure Laterality Date  . Breast surgery    . Abdominal hysterectomy    . Nose repair    . Tonsillectomy    . Hand surgery      5 surgeries on right hand 2 on left hand  . Abdominal surgery     Family History  Problem Relation Age of Onset  . Hypertension Mother   . Stroke Mother   . Cancer Mother   . Asthma Mother   . Hypertension Father   . Stroke Father    Social History  Substance Use Topics  . Smoking status: Never Smoker   . Smokeless tobacco: Never Used  . Alcohol Use: No   OB History    No data available     Review of Systems  Allergies  Codeine; Darvon; Dilaudid; Doxycycline; Keflex; Morphine and related; Percocet; and Vicodin  Home Medications   Prior to Admission medications   Medication Sig Start Date End Date Taking? Authorizing Provider  amLODipine (NORVASC) 10 MG tablet Take 10 mg by mouth daily.   Yes Historical Provider, MD  cholecalciferol (VITAMIN D) 1000 units tablet Take 1,000 Units by mouth daily.   Yes Historical Provider, MD  hydrochlorothiazide (HYDRODIURIL) 25 MG tablet Take 25 mg by mouth daily.   Yes Historical Provider, MD  levothyroxine (SYNTHROID,  LEVOTHROID) 100 MCG tablet Take 100 mcg by mouth daily before breakfast.   Yes Historical Provider, MD  lisinopril (PRINIVIL,ZESTRIL) 10 MG tablet Take 10 mg by mouth daily.   Yes Historical Provider, MD  meloxicam (MOBIC) 15 MG tablet Take 1 tablet (15 mg total) by mouth daily. 12/20/15  Yes Hassan RowanEugene Wade, MD  montelukast (SINGULAIR) 10 MG tablet Take 10 mg by mouth at bedtime.   Yes Historical Provider, MD  potassium chloride SA (K-DUR,KLOR-CON) 20 MEQ tablet Take 20 mEq by mouth 2 (two) times daily.   Yes Historical Provider, MD  cyclobenzaprine (FLEXERIL) 10 MG tablet Take 10 mg by mouth 3 (three) times daily as needed for muscle spasms.    Historical Provider, MD  Melatonin 10 MG TABS Take 1 tablet by mouth.    Historical Provider, MD  metaxalone (SKELAXIN) 800 MG tablet Take 800 mg by mouth 3 (three) times daily.    Historical Provider, MD  methocarbamol (ROBAXIN) 750 MG tablet Take 750 mg by mouth 4 (four) times daily.    Historical Provider, MD   Meds Ordered and Administered this Visit  Medications - No data to display  BP 112/85 mmHg  Pulse 90  Temp(Src) 98.3 F (36.8 C)  Resp 18  Ht 4\' 11"  (1.499 m)  Wt 148 lb (67.132 kg)  BMI 29.88  kg/m2  SpO2 97% No data found.   Physical Exam  Constitutional: She appears well-developed and well-nourished. No distress.  Musculoskeletal:       Left foot: There is tenderness, bony tenderness (toes) and swelling. There is normal range of motion, normal capillary refill, no crepitus, no deformity and no laceration.  ecchymosis  Skin: She is not diaphoretic.  Nursing note and vitals reviewed.   ED Course  Procedures (including critical care time)  Labs Review Labs Reviewed - No data to display  Imaging Review Dg Foot Complete Left  04/28/2016  CLINICAL DATA:  Pain and swelling, bruising of the left foot EXAM: LEFT FOOT - COMPLETE 3+ VIEW COMPARISON:  None. FINDINGS: There is no evidence of fracture or dislocation. There is mild  osteoarthritis of the first MTP joint. Soft tissues are unremarkable. IMPRESSION: No acute osseous injury of the left foot. Electronically Signed   By: Elige Ko   On: 04/28/2016 20:21     Visual Acuity Review  Right Eye Distance:   Left Eye Distance:   Bilateral Distance:    Right Eye Near:   Left Eye Near:    Bilateral Near:         MDM   1. Foot sprain, left, initial encounter    Discharge Medication List as of 04/28/2016  8:38 PM     1. x-ray results and diagnosis reviewed with patient 2. Recommend supportive treatment with current medications, otc analgesics prn, rest, ice, elevation 3. Follow-up prn if symptoms worsen or don't improve    Diana Mccallum, MD 04/30/16 1740

## 2016-04-28 NOTE — ED Notes (Signed)
Patient noticed that her left foot was severely bruised last night. She is not sure how she hurt her foot. Neuropathy is present in left foot.

## 2016-04-28 NOTE — Discharge Instructions (Signed)

## 2017-06-27 ENCOUNTER — Ambulatory Visit
Admission: EM | Admit: 2017-06-27 | Discharge: 2017-06-27 | Disposition: A | Discharge status: Home / Self Care | Payer: Medicaid Other | Attending: Family Medicine | Admitting: Family Medicine

## 2017-06-27 ENCOUNTER — Ambulatory Visit: Payer: Medicaid Other

## 2017-06-27 ENCOUNTER — Encounter: Payer: Self-pay | Admitting: *Deleted

## 2017-06-27 DIAGNOSIS — Z90710 Acquired absence of both cervix and uterus: Secondary | ICD-10-CM | POA: Insufficient documentation

## 2017-06-27 DIAGNOSIS — Z823 Family history of stroke: Secondary | ICD-10-CM | POA: Diagnosis not present

## 2017-06-27 DIAGNOSIS — Z825 Family history of asthma and other chronic lower respiratory diseases: Secondary | ICD-10-CM | POA: Insufficient documentation

## 2017-06-27 DIAGNOSIS — I10 Essential (primary) hypertension: Secondary | ICD-10-CM | POA: Insufficient documentation

## 2017-06-27 DIAGNOSIS — S61511A Laceration without foreign body of right wrist, initial encounter: Secondary | ICD-10-CM | POA: Diagnosis not present

## 2017-06-27 DIAGNOSIS — Z79899 Other long term (current) drug therapy: Secondary | ICD-10-CM | POA: Insufficient documentation

## 2017-06-27 DIAGNOSIS — S60221A Contusion of right hand, initial encounter: Secondary | ICD-10-CM | POA: Diagnosis not present

## 2017-06-27 DIAGNOSIS — S6991XA Unspecified injury of right wrist, hand and finger(s), initial encounter: Secondary | ICD-10-CM | POA: Diagnosis not present

## 2017-06-27 DIAGNOSIS — Z9889 Other specified postprocedural states: Secondary | ICD-10-CM | POA: Insufficient documentation

## 2017-06-27 DIAGNOSIS — Z8249 Family history of ischemic heart disease and other diseases of the circulatory system: Secondary | ICD-10-CM | POA: Diagnosis not present

## 2017-06-27 DIAGNOSIS — M67431 Ganglion, right wrist: Secondary | ICD-10-CM | POA: Insufficient documentation

## 2017-06-27 DIAGNOSIS — Z888 Allergy status to other drugs, medicaments and biological substances status: Secondary | ICD-10-CM | POA: Diagnosis not present

## 2017-06-27 DIAGNOSIS — Z885 Allergy status to narcotic agent status: Secondary | ICD-10-CM | POA: Insufficient documentation

## 2017-06-27 DIAGNOSIS — W231XXA Caught, crushed, jammed, or pinched between stationary objects, initial encounter: Secondary | ICD-10-CM | POA: Diagnosis not present

## 2017-06-27 DIAGNOSIS — X58XXXA Exposure to other specified factors, initial encounter: Secondary | ICD-10-CM | POA: Insufficient documentation

## 2017-06-27 DIAGNOSIS — G5601 Carpal tunnel syndrome, right upper limb: Secondary | ICD-10-CM | POA: Diagnosis not present

## 2017-06-27 DIAGNOSIS — E079 Disorder of thyroid, unspecified: Secondary | ICD-10-CM | POA: Insufficient documentation

## 2017-06-27 MED ORDER — MELOXICAM 15 MG PO TABS: 15.0000 mg | ORAL_TABLET | Freq: Every day | ORAL | 0 refills | Status: DC

## 2017-06-27 NOTE — ED Notes (Signed)
Wrist brace applied to right wrist. PMS intact post application

## 2017-06-27 NOTE — ED Provider Notes (Signed)
[facility]    CSN: [id number] Arrival date & time: 06/27/17  1556     History   Chief Complaint Chief Complaint  Patient presents with  . Hand Injury    HPI Diana PrestoHeather M Mcintosh is a 41 y.o. female.   Patient is a 41 year old female who presents complaining of pain to her right hand. Patient states that they are in the process of moving and last Saturday she is moving a wooden chair into a trailer when her hand got caught between the chair and the trauma around the doorway of a trailer. Patient stated mainly she had some pain as well as hematoma of the skin. Patient states that she thought something popped up maybe like a bone initially. She states that about 30 minutes after the injury she can stretch out her hand with dorsiflexion of the wrist which is accompanied by a great deal of pain and feeling like that bone may have moved back into place. She states she has been taking meloxicam for her pain. She has not done any ice or wrapping. 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   Patient does report history of right carpal tunnel surgery, trigger finger procedures 2, right dorsal wrist ganglion cyst removal, and repair of a deep laceration to the palmar side of the right wrist and hand.     Past Medical History:  Diagnosis Date  . Back pain   . Hypertension   . Neck pain   . Seasonal allergies   . Shoulder pain   . Thyroid disease     There are no active problems to display for this patient.   Past Surgical History:  Procedure Laterality Date  . ABDOMINAL HYSTERECTOMY    . ABDOMINAL SURGERY    . BREAST SURGERY    . HAND SURGERY     5 surgeries on right hand 2 on left hand  . nose repair    . TONSILLECTOMY      OB History    No data available       Home Medications    Prior to Admission medications   Medication Sig Start Date End Date Taking? Authorizing  Provider  amLODipine (NORVASC) 10 MG tablet Take 10 mg by mouth daily.   Yes [provider]  cholecalciferol (VITAMIN D) 1000 units tablet Take 1,000 Units by mouth daily.   Yes [provider]  cyclobenzaprine (FLEXERIL) 10 MG tablet Take 10 mg by mouth 3 (three) times daily as needed for muscle spasms.   Yes [provider]  hydrochlorothiazide (HYDRODIURIL) 25 MG tablet Take 25 mg by mouth daily.   Yes [provider]  levothyroxine (SYNTHROID, LEVOTHROID) 88 MCG tablet Take 88 mcg by mouth daily before breakfast.   Yes [provider]  liothyronine (CYTOMEL) 5 MCG tablet Take 5 mcg by mouth daily.   Yes [provider]  meloxicam (MOBIC) 15 MG tablet Take 1 tablet (15 mg total) by mouth daily. 12/20/15  Yes Hassan RowanWade, Eugene, MD  montelukast (SINGULAIR) 10 MG tablet Take 10 mg by mouth at bedtime.   Yes [provider]  potassium chloride SA (K-DUR,KLOR-CON) 20 MEQ tablet Take 20 mEq by mouth 2 (two) times daily.   Yes [provider]  levothyroxine (SYNTHROID, LEVOTHROID) 100 MCG tablet Take 100 mcg by mouth daily before breakfast.    [provider]  lisinopril (  PRINIVIL,ZESTRIL) 10 MG tablet Take 10 mg by mouth daily.    [provider]  Melatonin 10 MG TABS Take 1 tablet by mouth.    [provider]  meloxicam (MOBIC) 15 MG tablet Take 1 tablet (15 mg total) by mouth daily. 06/27/17   Candis Schatz, PA-C  metaxalone (SKELAXIN) 800 MG tablet Take 800 mg by mouth 3 (three) times daily.    [provider]  methocarbamol (ROBAXIN) 750 MG tablet Take 750 mg by mouth 4 (four) times daily.    [provider]    Family History Family History  Problem Relation Age of Onset  . Hypertension Mother   . Stroke Mother   . Cancer Mother   . Asthma Mother   . Hypertension Father   . Stroke Father     Social History Social History  Substance Use Topics  . Smoking status: Never  Smoker  . Smokeless tobacco: Never Used  . Alcohol use No     Allergies   Codeine; Darvon [propoxyphene]; Dilaudid [hydromorphone hcl]; Doxycycline; Keflex [cephalexin]; Morphine and related; Percocet [oxycodone-acetaminophen]; and Vicodin [hydrocodone-acetaminophen]   Review of Systems Review of Systems  As noted in history of present illness. Other systems reviewed and found to be negative  Physical Exam Triage Vital Signs ED Triage Vitals  Enc Vitals Group     BP 06/27/17 1604 (!) 136/96     Pulse Rate 06/27/17 1604 (!) 103     Resp 06/27/17 1604 16     Temp 06/27/17 1604 97.9 F (36.6 C)     Temp Source 06/27/17 1604 Oral     SpO2 06/27/17 1604 100 %     Weight 06/27/17 1605 160 lb (72.6 kg)     Height 06/27/17 1605  (1.499 m)     Head Circumference --      Peak Flow --      Pain Score 06/27/17 1605 8     Pain Loc --      Pain Edu? --      Excl. in GC? --    No data found.   Updated Vital Signs BP (!) 136/96 (BP Location: Left Arm)   Pulse (!) 103   Temp 97.9 F (36.6 C) (Oral)   Resp 16   Ht  (1.499 m)   Wt 160 lb (72.6 kg)   SpO2 100%   BMI 32.32 kg/m    Physical Exam  Constitutional: She appears well-developed and well-nourished. No distress.  HENT:  Head: Normocephalic and atraumatic.  Eyes: Pupils are equal, round, and reactive to light. EOM are normal.  Neck: Normal range of motion.  Cardiovascular: Normal rate and regular rhythm.   Pulmonary/Chest: Effort normal.  Musculoskeletal:       Right hand: She exhibits tenderness, bony tenderness and deformity.       Hands:    UC Treatments / Results  Labs (all labs ordered are listed, but only abnormal results are displayed) Labs Reviewed - No data to display  EKG  EKG Interpretation None       Radiology Dg Hand Complete Right  Result Date: 06/27/2017 CLINICAL DATA:  Right posterior hand pain. EXAM: RIGHT HAND - COMPLETE 3+ VIEW COMPARISON:  None. FINDINGS: There is no  evidence of fracture or dislocation. There is an old ununited bony fragment adjacent to the ulnar styloid process. There is no evidence of arthropathy or other focal bone abnormality. Soft tissues are unremarkable. IMPRESSION: No acute osseous injury of the  right hand. Electronically Signed   By: Elige Ko   On: 06/27/2017 17:00    Procedures Procedures (including critical care time)  Medications Ordered in UC Medications - No data to display   Initial Impression / Assessment and Plan / UC Course  I have reviewed the triage vital signs and the nursing notes.  Pertinent labs & imaging results that were available during my care of the patient were reviewed by me and considered in my medical decision making (see chart for details).    Patient with pain and swelling after getting her hand, between a wooden chair in the tremor around a trailer door while moving last Saturday. Patient states she thought she had some bone sticking up under the skin: She flexed her hand with dorsal flexion that piece of bone seen to move back into place. Patient continues to have pain tenderness to palpation. X-ray Is negative for acute fracture. We'll place a thumb's point. Patient advised to wear the splint while using her hands. Patient given instructions for ice elevation and rest. Patient a prescription for meloxicam 15 mg daily as needed for pain. Patient advised to follow-up with her primary care provider she's had no improvement within 5-7 days.   Final Clinical Impressions(s) / UC Diagnoses   Final diagnoses:  Injury of right hand, initial encounter  Contusion of right hand, initial encounter    New Prescriptions New Prescriptions   MELOXICAM (MOBIC) 15 MG TABLET    Take 1 tablet (15 mg total) by mouth daily.     Controlled Substance Prescriptions [place] Controlled Substance Registry consulted? Not Applicable   Candis Schatz, PA-C    Candis Schatz, PA-C 06/27/17 [phone]

## 2017-06-27 NOTE — Discharge Instructions (Addendum)
-  meloxicam: 1 tablet daily as needed for pain -rest, ice, elevation: see attached -Wear brace as needed until symptoms improve. Try to avoid aggravating movements -Follow with primary care provider if no improvement over the next 5-7 days.

## 2017-06-27 NOTE — ED Triage Notes (Signed)
Patient injured her right hand while loading a chair into a trailer.

## 2018-03-27 ENCOUNTER — Ambulatory Visit: Payer: Medicaid Other

## 2018-03-27 ENCOUNTER — Encounter: Payer: Self-pay | Admitting: Gynecology

## 2018-03-27 ENCOUNTER — Other Ambulatory Visit: Payer: Self-pay

## 2018-03-27 ENCOUNTER — Ambulatory Visit
Admission: EM | Admit: 2018-03-27 | Discharge: 2018-03-27 | Disposition: A | Discharge status: Home / Self Care | Payer: Medicaid Other | Attending: Family Medicine | Admitting: Family Medicine

## 2018-03-27 DIAGNOSIS — X58XXXA Exposure to other specified factors, initial encounter: Secondary | ICD-10-CM | POA: Diagnosis not present

## 2018-03-27 DIAGNOSIS — I10 Essential (primary) hypertension: Secondary | ICD-10-CM | POA: Diagnosis not present

## 2018-03-27 DIAGNOSIS — Z79899 Other long term (current) drug therapy: Secondary | ICD-10-CM | POA: Insufficient documentation

## 2018-03-27 DIAGNOSIS — Z9049 Acquired absence of other specified parts of digestive tract: Secondary | ICD-10-CM | POA: Diagnosis not present

## 2018-03-27 DIAGNOSIS — Z8249 Family history of ischemic heart disease and other diseases of the circulatory system: Secondary | ICD-10-CM | POA: Diagnosis not present

## 2018-03-27 DIAGNOSIS — Z791 Long term (current) use of non-steroidal anti-inflammatories (NSAID): Secondary | ICD-10-CM | POA: Diagnosis not present

## 2018-03-27 DIAGNOSIS — S93602A Unspecified sprain of left foot, initial encounter: Secondary | ICD-10-CM

## 2018-03-27 DIAGNOSIS — M79672 Pain in left foot: Secondary | ICD-10-CM | POA: Diagnosis present

## 2018-03-27 DIAGNOSIS — Z79890 Hormone replacement therapy (postmenopausal): Secondary | ICD-10-CM | POA: Insufficient documentation

## 2018-03-27 DIAGNOSIS — Z881 Allergy status to other antibiotic agents status: Secondary | ICD-10-CM | POA: Diagnosis not present

## 2018-03-27 DIAGNOSIS — Z823 Family history of stroke: Secondary | ICD-10-CM | POA: Insufficient documentation

## 2018-03-27 DIAGNOSIS — E079 Disorder of thyroid, unspecified: Secondary | ICD-10-CM | POA: Insufficient documentation

## 2018-03-27 DIAGNOSIS — Z885 Allergy status to narcotic agent status: Secondary | ICD-10-CM | POA: Diagnosis not present

## 2018-03-27 DIAGNOSIS — Z825 Family history of asthma and other chronic lower respiratory diseases: Secondary | ICD-10-CM | POA: Insufficient documentation

## 2018-03-27 NOTE — ED Triage Notes (Signed)
Patient c/o left foot pain x 4 days. Per patient deny injury to her left foot.

## 2018-03-27 NOTE — Discharge Instructions (Signed)
Rest, ice, elevation, ibuprofen °

## 2018-03-27 NOTE — ED Provider Notes (Signed)
[facility]    CSN: [id number] Arrival date & time: 03/27/18  1533     History   Chief Complaint Chief Complaint  Patient presents with  . Foot Pain    HPI Diana Mcintosh is a 42 y.o. female.   42 yo female with c/o left foot pain for the past 4 days, progressively worsening. States she has been doing a lot of work but does not recall any specific injury. States, however that she has some neuropathy and may have injured it without noticing.    Foot Pain     Past Medical History:  Diagnosis Date  . Back pain   . Hypertension   . Neck pain   . Seasonal allergies   . Shoulder pain   . Thyroid disease     There are no active problems to display for this patient.   Past Surgical History:  Procedure Laterality Date  . ABDOMINAL HYSTERECTOMY    . ABDOMINAL SURGERY    . BREAST SURGERY    . HAND SURGERY     5 surgeries on right hand 2 on left hand  . nose repair    . TONSILLECTOMY      OB History   None      Home Medications    Prior to Admission medications   Medication Sig Start Date End Date Taking? Authorizing Provider  amLODipine (NORVASC) 10 MG tablet Take 10 mg by mouth daily.   Yes [provider]  cholecalciferol (VITAMIN D) 1000 units tablet Take 1,000 Units by mouth daily.   Yes [provider]  cyclobenzaprine (FLEXERIL) 10 MG tablet Take 10 mg by mouth 3 (three) times daily as needed for muscle spasms.   Yes [provider]  hydrochlorothiazide (HYDRODIURIL) 25 MG tablet Take 25 mg by mouth daily.   Yes [provider]  levothyroxine (SYNTHROID, LEVOTHROID) 100 MCG tablet Take 100 mcg by mouth daily before breakfast.   Yes [provider]  liothyronine (CYTOMEL) 5 MCG tablet Take 5 mcg by mouth daily.   Yes [provider]  lisinopril (PRINIVIL,ZESTRIL) 10 MG tablet Take 10 mg by mouth daily.   Yes [provider]  meloxicam (MOBIC) 15 MG tablet Take 1 tablet (15 mg total)  by mouth daily. 12/20/15  Yes Hassan Rowan, MD  montelukast (SINGULAIR) 10 MG tablet Take 10 mg by mouth at bedtime.   Yes [provider]  potassium chloride SA (K-DUR,KLOR-CON) 20 MEQ tablet Take 20 mEq by mouth 2 (two) times daily.   Yes [provider]  levothyroxine (SYNTHROID, LEVOTHROID) 88 MCG tablet Take 88 mcg by mouth daily before breakfast.    [provider]  Melatonin 10 MG TABS Take 1 tablet by mouth.    [provider]  meloxicam (MOBIC) 15 MG tablet Take 1 tablet (15 mg total) by mouth daily. 06/27/17   Candis Schatz, PA-C  metaxalone (SKELAXIN) 800 MG tablet Take 800 mg by mouth 3 (three) times daily.    [provider]  methocarbamol (ROBAXIN) 750 MG tablet Take 750 mg by mouth 4 (four) times daily.    [provider]    Family History Family History  Problem Relation Age of Onset  . Hypertension Mother   . Stroke Mother   . Cancer Mother   . Asthma Mother   . Hypertension Father   . Stroke Father     Social History Social History   Tobacco Use  . Smoking status: Never  Smoker  . Smokeless tobacco: Never Used  Substance Use Topics  . Alcohol use: No  . Drug use: No     Allergies   Codeine; Darvon [propoxyphene]; Dilaudid [hydromorphone hcl]; Doxycycline; Keflex [cephalexin]; Morphine and related; Percocet [oxycodone-acetaminophen]; and Vicodin [hydrocodone-acetaminophen]   Review of Systems Review of Systems   Physical Exam Triage Vital Signs ED Triage Vitals  Enc Vitals Group     BP 03/27/18 1609 117/84     Pulse Rate 03/27/18 1609 98     Resp 03/27/18 1609 16     Temp 03/27/18 1609 98.1 F (36.7 C)     Temp Source 03/27/18 1609 Oral     SpO2 03/27/18 1609 100 %     Weight 03/27/18 1611 162 lb (73.5 kg)     Height --      Head Circumference --      Peak Flow --      Pain Score --      Pain Loc --      Pain Edu? --      Excl. in GC? --    No data found.  Updated Vital Signs BP  117/84 (BP Location: Left Arm)   Pulse 98   Temp 98.1 F (36.7 C) (Oral)   Resp 16   Wt 162 lb (73.5 kg)   SpO2 100%   BMI 32.72 kg/m   Visual Acuity Right Eye Distance:   Left Eye Distance:   Bilateral Distance:    Right Eye Near:   Left Eye Near:    Bilateral Near:     Physical Exam  Constitutional: She appears well-developed and well-nourished. No distress.  Musculoskeletal:       Left foot: There is tenderness (diffuse) and swelling. There is normal range of motion, normal capillary refill, no crepitus and no deformity.  Skin: She is not diaphoretic.  Nursing note and vitals reviewed.    UC Treatments / Results  Labs (all labs ordered are listed, but only abnormal results are displayed) Labs Reviewed - No data to display  EKG None  Radiology Dg Foot Complete Left  Result Date: 03/27/2018 CLINICAL DATA:  Diffuse left foot pain.  Peripheral neuropathy. EXAM: LEFT FOOT - COMPLETE 3+ VIEW COMPARISON:  04/28/2016 FINDINGS: There is no evidence of fracture or dislocation. There is no evidence of arthropathy. Small dorsal calcaneal bone spur is unchanged. Soft tissues are unremarkable. IMPRESSION: No acute findings. Electronically Signed   By: Myles RosenthalJohn  Stahl M.D.   On: 03/27/2018 17:13    Procedures Procedures (including critical care time)  Medications Ordered in UC Medications - No data to display  Initial Impression / Assessment and Plan / UC Course  I have reviewed the triage vital signs and the nursing notes.  Pertinent labs & imaging results that were available during my care of the patient were reviewed by me and considered in my medical decision making (see chart for details).      Final Clinical Impressions(s) / UC Diagnoses   Final diagnoses:  Foot sprain, left, initial encounter     Discharge Instructions     Rest, ice, elevation, ibuprofen    ED Prescriptions    None     1. x-ray results and diagnosis reviewed with patient 2. Recommend  supportive treatment with rest, ice, elevation, otc analgesics prn 3. Follow-up prn if symptoms worsen or don't improve  Controlled Substance Prescriptions [place] Controlled Substance Registry consulted? Not Applicable   Payton Mccallumonty, Keywon Mestre, MD 03/27/18 [phone]

## 2019-01-07 ENCOUNTER — Telehealth: Payer: Self-pay | Admitting: *Deleted

## 2019-01-07 NOTE — Telephone Encounter (Signed)
032020/gave information about the different between disposable and reusable cloth face mask.  Discussed the fact that cloth ones hold bacteria and particles and have to be washed after every use.  The regular face mask do not protect from the cvid19 and that n95 face mask have to be used.  Caller understands.  Did also explain the difference between hand sanitizers and washing the hands in hot water and soap.  Caller pleased with information.

## 2020-08-05 ENCOUNTER — Other Ambulatory Visit: Payer: Self-pay

## 2020-08-05 ENCOUNTER — Emergency Department: Payer: Medicaid Other

## 2020-08-05 ENCOUNTER — Observation Stay: Payer: Medicaid Other

## 2020-08-05 ENCOUNTER — Encounter: Payer: Self-pay | Admitting: Emergency Medicine

## 2020-08-05 ENCOUNTER — Observation Stay
Admission: EM | Admit: 2020-08-05 | Discharge: 2020-08-06 | Disposition: A | Discharge status: Home / Self Care | Payer: Medicaid Other | Attending: Family Medicine | Admitting: Family Medicine

## 2020-08-05 DIAGNOSIS — Z20822 Contact with and (suspected) exposure to covid-19: Secondary | ICD-10-CM | POA: Insufficient documentation

## 2020-08-05 DIAGNOSIS — G9341 Metabolic encephalopathy: Secondary | ICD-10-CM | POA: Insufficient documentation

## 2020-08-05 DIAGNOSIS — G7000 Myasthenia gravis without (acute) exacerbation: Secondary | ICD-10-CM | POA: Insufficient documentation

## 2020-08-05 DIAGNOSIS — E039 Hypothyroidism, unspecified: Secondary | ICD-10-CM | POA: Insufficient documentation

## 2020-08-05 DIAGNOSIS — R531 Weakness: Secondary | ICD-10-CM | POA: Diagnosis present

## 2020-08-05 DIAGNOSIS — R6510 Systemic inflammatory response syndrome (SIRS) of non-infectious origin without acute organ dysfunction: Secondary | ICD-10-CM

## 2020-08-05 DIAGNOSIS — Z79899 Other long term (current) drug therapy: Secondary | ICD-10-CM | POA: Diagnosis not present

## 2020-08-05 DIAGNOSIS — R945 Abnormal results of liver function studies: Secondary | ICD-10-CM | POA: Diagnosis not present

## 2020-08-05 DIAGNOSIS — E890 Postprocedural hypothyroidism: Secondary | ICD-10-CM | POA: Diagnosis present

## 2020-08-05 DIAGNOSIS — M797 Fibromyalgia: Secondary | ICD-10-CM | POA: Diagnosis present

## 2020-08-05 DIAGNOSIS — E876 Hypokalemia: Secondary | ICD-10-CM | POA: Diagnosis not present

## 2020-08-05 DIAGNOSIS — I10 Essential (primary) hypertension: Secondary | ICD-10-CM | POA: Diagnosis not present

## 2020-08-05 DIAGNOSIS — R7989 Other specified abnormal findings of blood chemistry: Secondary | ICD-10-CM

## 2020-08-05 DIAGNOSIS — R0682 Tachypnea, not elsewhere classified: Secondary | ICD-10-CM

## 2020-08-05 LAB — LACTIC ACID, PLASMA: Lactic Acid, Venous: 1.2 mmol/L (ref 0.5–1.9)

## 2020-08-05 LAB — URINALYSIS, COMPLETE (UACMP) WITH MICROSCOPIC
Bacteria, UA: NONE SEEN
Bilirubin Urine: NEGATIVE
Glucose, UA: NEGATIVE mg/dL
Ketones, ur: 20 mg/dL — AB
Leukocytes,Ua: NEGATIVE
Nitrite: NEGATIVE
Protein, ur: NEGATIVE mg/dL
Specific Gravity, Urine: 1.004 — ABNORMAL LOW (ref 1.005–1.030)
Squamous Epithelial / LPF: NONE SEEN (ref 0–5)
pH: 8 (ref 5.0–8.0)

## 2020-08-05 LAB — TSH: TSH: 0.244 u[IU]/mL — ABNORMAL LOW (ref 0.350–4.500)

## 2020-08-05 LAB — COMPREHENSIVE METABOLIC PANEL
ALT: 57 U/L — ABNORMAL HIGH (ref 0–44)
AST: 43 U/L — ABNORMAL HIGH (ref 15–41)
Albumin: 5.1 g/dL — ABNORMAL HIGH (ref 3.5–5.0)
Alkaline Phosphatase: 129 U/L — ABNORMAL HIGH (ref 38–126)
Anion gap: 14 (ref 5–15)
BUN: 9 mg/dL (ref 6–20)
CO2: 26 mmol/L (ref 22–32)
Calcium: 10.1 mg/dL (ref 8.9–10.3)
Chloride: 97 mmol/L — ABNORMAL LOW (ref 98–111)
Creatinine, Ser: 0.6 mg/dL (ref 0.44–1.00)
GFR, Estimated: >60 mL/min (ref >60)
Glucose, Bld: 101 mg/dL — ABNORMAL HIGH (ref 70–99)
Potassium: 2.7 mmol/L — CL (ref 3.5–5.1)
Sodium: 137 mmol/L (ref 135–145)
Total Bilirubin: 1.5 mg/dL — ABNORMAL HIGH (ref 0.3–1.2)
Total Protein: 8.7 g/dL — ABNORMAL HIGH (ref 6.5–8.1)

## 2020-08-05 LAB — CBC WITH DIFFERENTIAL/PLATELET
Abs Immature Granulocytes: 0.04 10*3/uL (ref 0.00–0.07)
Basophils Absolute: 0 10*3/uL (ref 0.0–0.1)
Basophils Relative: 0 %
Eosinophils Absolute: 0 10*3/uL (ref 0.0–0.5)
Eosinophils Relative: 0 %
HCT: 44 % (ref 36.0–46.0)
Hemoglobin: 15.1 g/dL — ABNORMAL HIGH (ref 12.0–15.0)
Immature Granulocytes: 0 %
Lymphocytes Relative: 19 %
Lymphs Abs: 2.1 10*3/uL (ref 0.7–4.0)
MCH: 28.7 pg (ref 26.0–34.0)
MCHC: 34.3 g/dL (ref 30.0–36.0)
MCV: 83.7 fL (ref 80.0–100.0)
Monocytes Absolute: 0.5 10*3/uL (ref 0.1–1.0)
Monocytes Relative: 4 %
Neutro Abs: 8.4 10*3/uL — ABNORMAL HIGH (ref 1.7–7.7)
Neutrophils Relative %: 77 %
Platelets: 298 10*3/uL (ref 150–400)
RBC: 5.26 MIL/uL — ABNORMAL HIGH (ref 3.87–5.11)
RDW: 13.1 % (ref 11.5–15.5)
WBC: 11.1 10*3/uL — ABNORMAL HIGH (ref 4.0–10.5)
nRBC: 0 % (ref 0.0–0.2)

## 2020-08-05 LAB — ETHANOL: Alcohol, Ethyl (B): <10 mg/dL (ref <10)

## 2020-08-05 LAB — URINE DRUG SCREEN, QUALITATIVE (ARMC ONLY)
Amphetamines, Ur Screen: POSITIVE — AB
Barbiturates, Ur Screen: NOT DETECTED
Benzodiazepine, Ur Scrn: NOT DETECTED
Cannabinoid 50 Ng, Ur ~~LOC~~: NOT DETECTED
Cocaine Metabolite,Ur ~~LOC~~: NOT DETECTED
MDMA (Ecstasy)Ur Screen: NOT DETECTED
Methadone Scn, Ur: NOT DETECTED
Opiate, Ur Screen: NOT DETECTED
Phencyclidine (PCP) Ur S: NOT DETECTED
Tricyclic, Ur Screen: NOT DETECTED

## 2020-08-05 LAB — ACETAMINOPHEN LEVEL: Acetaminophen (Tylenol), Serum: <10 ug/mL — ABNORMAL LOW (ref 10–30)

## 2020-08-05 LAB — TROPONIN I (HIGH SENSITIVITY)
Troponin I (High Sensitivity): 3 ng/L (ref <18)
Troponin I (High Sensitivity): 4 ng/L (ref <18)

## 2020-08-05 LAB — POTASSIUM: Potassium: 3 mmol/L — ABNORMAL LOW (ref 3.5–5.1)

## 2020-08-05 LAB — POC URINE PREG, ED: Preg Test, Ur: NEGATIVE

## 2020-08-05 LAB — FIBRIN DERIVATIVES D-DIMER (ARMC ONLY): Fibrin derivatives D-dimer (ARMC): 142.02 ng/mL (FEU) (ref 0.00–499.00)

## 2020-08-05 LAB — AMMONIA: Ammonia: 19 umol/L (ref 9–35)

## 2020-08-05 MED ORDER — HYDROCHLOROTHIAZIDE 25 MG PO TABS
25.0000 mg | ORAL_TABLET | Freq: Every day | ORAL | Status: DC
  Administered 2020-08-06: 25 mg via ORAL
  Filled 2020-08-05: qty 1

## 2020-08-05 MED ORDER — POTASSIUM CHLORIDE CRYS ER 20 MEQ PO TBCR
40.0000 meq | EXTENDED_RELEASE_TABLET | Freq: Once | ORAL | Status: AC
  Administered 2020-08-05: 40 meq via ORAL
  Filled 2020-08-05: qty 2

## 2020-08-05 MED ORDER — POTASSIUM CHLORIDE 10 MEQ/100ML IV SOLN
10.0000 meq | Freq: Once | INTRAVENOUS | Status: AC
  Administered 2020-08-05: 10 meq via INTRAVENOUS
  Filled 2020-08-05: qty 100

## 2020-08-05 MED ORDER — LISINOPRIL 10 MG PO TABS
10.0000 mg | ORAL_TABLET | Freq: Every day | ORAL | Status: DC
  Filled 2020-08-05 (×2): qty 1

## 2020-08-05 MED ORDER — LEVOTHYROXINE SODIUM 50 MCG PO TABS: 100.0000 ug | ORAL_TABLET | Freq: Every day | ORAL | Status: DC

## 2020-08-05 MED ORDER — AMLODIPINE BESYLATE 5 MG PO TABS
10.0000 mg | ORAL_TABLET | Freq: Every day | ORAL | Status: DC
  Administered 2020-08-06: 10 mg via ORAL
  Filled 2020-08-05: qty 2

## 2020-08-05 MED ORDER — ENOXAPARIN SODIUM 40 MG/0.4ML ~~LOC~~ SOLN: 40.0000 mg | SUBCUTANEOUS | Status: DC

## 2020-08-05 MED ORDER — SODIUM CHLORIDE 0.9 % IV BOLUS
1000.0000 mL | Freq: Once | INTRAVENOUS | Status: AC
  Administered 2020-08-05: 1000 mL via INTRAVENOUS

## 2020-08-05 MED ORDER — LEVOTHYROXINE SODIUM 88 MCG PO TABS
88.0000 ug | ORAL_TABLET | Freq: Every day | ORAL | Status: DC
  Filled 2020-08-05: qty 1

## 2020-08-05 MED ORDER — METHYLPREDNISOLONE SODIUM SUCC 125 MG IJ SOLR
125.0000 mg | Freq: Once | INTRAMUSCULAR | Status: AC
  Administered 2020-08-05: 125 mg via INTRAVENOUS
  Filled 2020-08-05: qty 2

## 2020-08-05 MED ORDER — POTASSIUM CHLORIDE 10 MEQ/100ML IV SOLN
10.0000 meq | INTRAVENOUS | Status: AC
  Administered 2020-08-05 (×2): 10 meq via INTRAVENOUS
  Filled 2020-08-05 (×2): qty 100

## 2020-08-05 NOTE — ED Notes (Signed)
MD at bedside. 

## 2020-08-05 NOTE — ED Notes (Signed)
Pt intermittant cathed due to still not being able to feel her lower half.

## 2020-08-05 NOTE — ED Triage Notes (Addendum)
Arrives via [facility].  EMS called for 'unresponsive'.  ON EMS arrival, patient breathing fine. Family reported that patient had been out running errands and had come home and layed down.  EMS states patient respond to ammonia inhalents and did shake head 'yes' when asked if she wanted clothes on to leave home.  Patient has history of Breast CA with double mastectomy.  VS wnl.  CBG:  98

## 2020-08-05 NOTE — H&P (Signed)
History and Physical    Diana Mcintosh YIR:[id number] DOB: 20-May-1976 DOA: 08/05/2020  PCP: Pcp, No   Patient coming from: Home  I have personally briefly reviewed patient's old medical records in [place]  Chief Complaint: Weakness  HPI: Diana Mcintosh is a 44 y.o. female with medical history significant for hypertension, post ablative hypothyroidism, fibromyalgia, history of weakness and paresthesia  with prior neurologic evaluation at [facility] [place] and [facility] to evaluate for MS and myasthenia though no such final diagnosis noted on chart review who presents to the emergency room after EMS was called out for unresponsiveness.  Family reported that patient has been running errands throughout the day and came home and laid down but then became very lethargic.  Patient with no awake and alert after several hours in the emergency room states that she has a diagnosis of myasthenia gravis and this is what it felt like the last time she had a flareup.  At the time of my evaluation she states that she is unable to move her legs and unable to urinate and while in the emergency room was straight cathed a couple times. ED Course: On arrival, patient was lethargic, tachycardic at 124, tachypneic at 27 afebrile, BP 158/105.  WBC 11.1, Hb 15, potassium 2.7 with mild elevation in LFTs, AST/ALT 43/57, alk phos 129, total bili 1.5.  Urinalysis with mild ketones of 20, EtOH less than 10.  UDS positive amphetamines.  CT head and C-spine unremarkable.  EKG as reviewed by me: Sinus tach at 123 with no acute ST-T wave changes  Review of Systems: As per HPI otherwise all other systems on review of systems negative.    Past Medical History:  Diagnosis Date  . Back pain   . Hypertension   . Myasthenia gravis ([place])   . Neck pain   . Seasonal allergies   . Shoulder pain   . Thyroid disease     Past Surgical History:  Procedure Laterality Date  . ABDOMINAL HYSTERECTOMY    . ABDOMINAL SURGERY    . BREAST  SURGERY    . HAND SURGERY     5 surgeries on right hand 2 on left hand  . nose repair    . TONSILLECTOMY       reports that she has never smoked. She has never used smokeless tobacco. She reports that she does not drink alcohol and does not use drugs.  Allergies  Allergen Reactions  . Codeine Anaphylaxis  . Darvon [Propoxyphene] Anaphylaxis    Darvocet  . Dilaudid [Hydromorphone Hcl] Anaphylaxis  . Doxycycline Anaphylaxis  . Keflex [Cephalexin] Anaphylaxis  . Morphine And Related Anaphylaxis  . Percocet [Oxycodone-Acetaminophen] Anaphylaxis  . Vicodin [Hydrocodone-Acetaminophen] Anaphylaxis  . Toradol [Ketorolac Tromethamine]     Family History  Problem Relation Age of Onset  . Hypertension Mother   . Stroke Mother   . Cancer Mother   . Asthma Mother   . Hypertension Father   . Stroke Father       Prior to Admission medications   Medication Sig Start Date End Date Taking? Authorizing Provider  amLODipine (NORVASC) 10 MG tablet Take 10 mg by mouth daily.    [provider]  cholecalciferol (VITAMIN D) 1000 units tablet Take 1,000 Units by mouth daily.    [provider]  cyclobenzaprine (FLEXERIL) 10 MG tablet Take 10 mg by mouth 3 (three) times daily as needed for muscle spasms.    [provider]  hydrochlorothiazide (HYDRODIURIL) 25 MG  tablet Take 25 mg by mouth daily.    [provider]  levothyroxine (SYNTHROID, LEVOTHROID) 100 MCG tablet Take 100 mcg by mouth daily before breakfast.    [provider]  levothyroxine (SYNTHROID, LEVOTHROID) 88 MCG tablet Take 88 mcg by mouth daily before breakfast.    [provider]  liothyronine (CYTOMEL) 5 MCG tablet Take 5 mcg by mouth daily.    [provider]  lisinopril (PRINIVIL,ZESTRIL) 10 MG tablet Take 10 mg by mouth daily.    [provider]  Melatonin 10 MG TABS Take 1 tablet by mouth.    [provider]  meloxicam (MOBIC) 15 MG tablet Take 1  tablet (15 mg total) by mouth daily. 12/20/15   Frederich Cha, MD  meloxicam (MOBIC) 15 MG tablet Take 1 tablet (15 mg total) by mouth daily. 06/27/17   Luvenia Redden, PA-C  metaxalone (SKELAXIN) 800 MG tablet Take 800 mg by mouth 3 (three) times daily.    [provider]  methocarbamol (ROBAXIN) 750 MG tablet Take 750 mg by mouth 4 (four) times daily.    [provider]  montelukast (SINGULAIR) 10 MG tablet Take 10 mg by mouth at bedtime.    [provider]  potassium chloride SA (K-DUR,KLOR-CON) 20 MEQ tablet Take 20 mEq by mouth 2 (two) times daily.    [provider]    Physical Exam: Vitals:   08/05/20 1730 08/05/20 1830 08/05/20 1900 08/05/20 1911  BP: (!) 158/105 (!) 125/97 126/89   Pulse: (!) 123 100 96   Resp: (!) 27 19 16    Temp:    98.4 F (36.9 C)  TempSrc:    Oral  SpO2: 100% 100% 100%   Weight:      Height:         Vitals:   08/05/20 1730 08/05/20 1830 08/05/20 1900 08/05/20 1911  BP: (!) 158/105 (!) 125/97 126/89   Pulse: (!) 123 100 96   Resp: (!) 27 19 16    Temp:    98.4 F (36.9 C)  TempSrc:    Oral  SpO2: 100% 100% 100%   Weight:      Height:          Constitutional: Alert and oriented x 3 . Not in any apparent distress HEENT:      Head: Normocephalic and atraumatic.         Eyes: PERLA, EOMI, Conjunctivae are normal. Sclera is non-icteric.       Mouth/Throat: Mucous membranes are moist.       Neck: Supple with no signs of meningismus. Cardiovascular: Regular rate and rhythm. No murmurs, gallops, or rubs. 2+ symmetrical distal pulses are present . No JVD. No LE edema Respiratory: Respiratory effort normal .Lungs sounds clear bilaterally. No wheezes, crackles, or rhonchi.  Gastrointestinal: Soft, non tender, and non distended with positive bowel sounds. No rebound or guarding. Genitourinary: No CVA tenderness. Musculoskeletal: Nontender with normal range of motion in all extremities. No cyanosis, or erythema of  extremities. Neurologic:  Face is symmetric.  Upper extremities moving, patient lying on her side with knees flexed but unable to participate in tests of strength.  Sensation is intact.  Skin: Skin is warm, dry.  No rash or ulcers Psychiatric: Mood and affect are normal    Labs on Admission: I have personally reviewed following labs and imaging studies  CBC: Recent Labs  Lab 08/05/20 1732  WBC 11.1*  NEUTROABS 8.4*  HGB 15.1*  HCT 44.0  MCV 83.7  PLT 884   Basic Metabolic Panel: Recent Labs  Lab 08/05/20 1732  NA 137  K 2.7*  CL 97*  CO2 26  GLUCOSE 101*  BUN 9  CREATININE 0.60  CALCIUM 10.1   GFR: Estimated Creatinine Clearance: 79.2 mL/min (by C-G formula based on SCr of 0.6 mg/dL). Liver Function Tests: Recent Labs  Lab 08/05/20 1732  AST 43*  ALT 57*  ALKPHOS 129*  BILITOT 1.5*  PROT 8.7*  ALBUMIN 5.1*   No results for input(s): LIPASE, AMYLASE in the last 168 hours. No results for input(s): AMMONIA in the last 168 hours. Coagulation Profile: No results for input(s): INR, PROTIME in the last 168 hours. Cardiac Enzymes: No results for input(s): CKTOTAL, CKMB, CKMBINDEX, TROPONINI in the last 168 hours. BNP (last 3 results) No results for input(s): PROBNP in the last 8760 hours. HbA1C: No results for input(s): HGBA1C in the last 72 hours. CBG: No results for input(s): GLUCAP in the last 168 hours. Lipid Profile: No results for input(s): CHOL, HDL, LDLCALC, TRIG, CHOLHDL, LDLDIRECT in the last 72 hours. Thyroid Function Tests: No results for input(s): TSH, T4TOTAL, FREET4, T3FREE, THYROIDAB in the last 72 hours. Anemia Panel: No results for input(s): VITAMINB12, FOLATE, FERRITIN, TIBC, IRON, RETICCTPCT in the last 72 hours. Urine analysis:    Component Value Date/Time   COLORURINE COLORLESS (A) 08/05/2020 1850   APPEARANCEUR CLEAR (A) 08/05/2020 1850   LABSPEC 1.004 (L) 08/05/2020 1850   PHURINE 8.0 08/05/2020 1850   GLUCOSEU NEGATIVE 08/05/2020  1850   HGBUR SMALL (A) 08/05/2020 1850   BILIRUBINUR NEGATIVE 08/05/2020 1850   KETONESUR 20 (A) 08/05/2020 1850   PROTEINUR NEGATIVE 08/05/2020 1850   NITRITE NEGATIVE 08/05/2020 1850   LEUKOCYTESUR NEGATIVE 08/05/2020 1850    Radiological Exams on Admission: CT Head Wo Contrast  Result Date: 08/05/2020 CLINICAL DATA:  Mental status changes EXAM: CT HEAD WITHOUT CONTRAST TECHNIQUE: Contiguous axial images were obtained from the base of the skull through the vertex without intravenous contrast. COMPARISON:  None. FINDINGS: Brain: No acute intracranial abnormality. Specifically, no hemorrhage, hydrocephalus, mass lesion, acute infarction, or significant intracranial injury. Vascular: No hyperdense vessel or unexpected calcification. Skull: No acute calvarial abnormality. Sinuses/Orbits: Visualized paranasal sinuses and mastoids clear. Orbital soft tissues unremarkable. Other: None IMPRESSION: Normal study. Electronically Signed   By: Rolm Baptise M.D.   On: 08/05/2020 18:06   CT Cervical Spine Wo Contrast  Result Date: 08/05/2020 CLINICAL DATA:  Neck pain following possible syncopal event EXAM: CT CERVICAL SPINE WITHOUT CONTRAST TECHNIQUE: Multidetector CT imaging of the cervical spine was performed without intravenous contrast. Multiplanar CT image reconstructions were also generated. COMPARISON:  07/24/2014 FINDINGS: Alignment: Normal. Skull base and vertebrae: 7 cervical segments are well visualized. Vertebral body height is well maintained. No acute fracture or acute facet abnormality is noted. Soft tissues and spinal canal: Surrounding soft tissue structures are within normal limits. Upper chest: Within normal limits. Other: None IMPRESSION: No acute abnormality in the cervical spine. Electronically Signed   By: Inez Catalina M.D.   On: 08/05/2020 18:08     Assessment/Plan 44 year old female with history of hypertension, post ablative hypothyroidism, fibromyalgia, history of neurologic  work-up at [facility] and at [facility] in 2020 that appeared to have ruled out MS and myasthenia who presents to the emergency room with several hour history of lethargy   Lower extremity/generalized weakness -Initially presented with generalized weakness but then symptoms improved to where she only had lower extremity weakness and possible neurogenic bladder, requesting  straight cath -head CT with no acute intracranial findings.  CT C-spine with no spinal cord abnormalities -Consider lumbar imaging -Saw [facility] neurology in September 2020.  Etiology of neurologic symptoms were undetermined at that time based on my interpretation and further work-up was in the works -Consider neurology consult in the a.m.  Hypokalemia -Likely related to home thiazide diuretic -IV and oral potassium repletion  Elevated LFTs -Mild AST ALT elevation as well as alk phos and total bilirubin but without complaints of pain -Follow-up hepatitis panel, -Tylenol and ammonia levels normal  Acute metabolic encephalopathy -Resolved.  EMS was called out for unresponsiveness.  -Head CT negative -UDS positive for amphetamines.  EtOH below 10 -Ammonia and acetaminophen levels within normal limits  SIRS -Patient with altered mental status, tachycardia of 120 and tachypnea of 27 with mild leukocytosis though afebrile and without localizing physical exam -Lactic acid and D-dimer normal  -Continue close monitor    Essential hypertension -Continue home antihypertensives, benazepril    Hypothyroidism, postablative -Continue home levothyroxine    Fibromyalgia -On meloxicam which we will hold for now     DVT prophylaxis: Lovenox  Code Status: full code  Family Communication:  none  Disposition Plan: Back to previous home environment Consults called: none  Status: Observation    Athena Masse MD [facility]     08/05/2020, 8:35 PM

## 2020-08-05 NOTE — ED Notes (Signed)
[phone] Mother nita Lad

## 2020-08-05 NOTE — ED Provider Notes (Signed)
[facility] Emergency Department Provider Note ____________________________________________   First MD Initiated Contact with Patient 08/05/20 1757     (approximate)  I have reviewed the triage vital signs and the nursing notes.   HISTORY  Chief Complaint Diana Mcintosh  HPI RAYE WIENS is a 44 y.o. female with history of hypertension presents to the emergency department for treatment and evaluation of generalized weakness. EMS report that she ran some errands today and came home and laid down. She apparently called someone to tell them that she didn't feel well and EMS was called. She was initially "unresponsive" but did respond to ammonia inhalents and shook her head in answer to some questions. She is still not moving extremities or following commands. She is scratching  her index fingers on the bed as if to spell something. She does say "nu uh" when asked about pain.        Past Medical History:  Diagnosis Date  . Back pain   . Hypertension   . Myasthenia gravis ([facility])   . Neck pain   . Seasonal allergies   . Shoulder pain   . Thyroid disease     Patient Active Problem List   Diagnosis Date Noted  . Weakness 08/05/2020  . Abnormal LFTs 08/05/2020  . Myasthenia gravis (HCC)   . Essential hypertension 07/05/2015  . Hypokalemia 05/24/2015  . 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 Fibromyalgia 08/29/2013    Past Surgical History:  Procedure Laterality Date  . ABDOMINAL HYSTERECTOMY    . ABDOMINAL SURGERY    . BREAST SURGERY    . HAND SURGERY     5 surgeries on right hand 2 on left hand  . nose repair    . TONSILLECTOMY      Prior to Admission medications   Medication Sig Start Date End Date Taking? Authorizing Provider  amLODipine (NORVASC) 10 MG tablet Take 10 mg by mouth daily.    [provider]  cholecalciferol (VITAMIN D) 1000 units tablet Take 1,000 Units by mouth daily.     [provider]  cyclobenzaprine (FLEXERIL) 10 MG tablet Take 10 mg by mouth 3 (three) times daily as needed for muscle spasms.    [provider]  hydrochlorothiazide (HYDRODIURIL) 25 MG tablet Take 25 mg by mouth daily.    [provider]  levothyroxine (SYNTHROID, LEVOTHROID) 100 MCG tablet Take 100 mcg by mouth daily before breakfast.    [provider]  levothyroxine (SYNTHROID, LEVOTHROID) 88 MCG tablet Take 88 mcg by mouth daily before breakfast.    [provider]  liothyronine (CYTOMEL) 5 MCG tablet Take 5 mcg by mouth daily.    [provider]  lisinopril (PRINIVIL,ZESTRIL) 10 MG tablet Take 10 mg by mouth daily.    [provider]  Melatonin 10 MG TABS Take 1 tablet by mouth.    [provider]  meloxicam (MOBIC) 15 MG tablet Take 1 tablet (15 mg total) by mouth daily. 12/20/15   Hassan Rowan, MD  meloxicam (MOBIC) 15 MG tablet Take 1 tablet (15 mg total) by mouth daily. 06/27/17   Candis Schatz, PA-C  metaxalone (SKELAXIN) 800 MG tablet Take 800 mg by mouth 3 (three) times daily.    [provider]  methocarbamol (ROBAXIN) 750 MG tablet Take 750 mg by mouth 4 (four) times daily.    [provider]  montelukast (SINGULAIR) 10 MG tablet Take 10 mg by mouth at bedtime.  [provider]  potassium chloride SA (K-DUR,KLOR-CON) 20 MEQ tablet Take 20 mEq by mouth 2 (two) times daily.    [provider]    Allergies Codeine, Darvon [propoxyphene], Dilaudid [hydromorphone hcl], Doxycycline, Keflex [cephalexin], Morphine and related, Percocet [oxycodone-acetaminophen], Vicodin [hydrocodone-acetaminophen], and Toradol [ketorolac tromethamine]  Family History  Problem Relation Age of Onset  . Hypertension Mother   . Stroke Mother   . Cancer Mother   . Asthma Mother   . Hypertension Father   . Stroke Father     Social History Social History   Tobacco Use  . Smoking status:  Never Smoker  . Smokeless tobacco: Never Used  Substance Use Topics  . Alcohol use: No  . Drug use: No    Review of Systems  Level 5 Caveat: patient unable or unwilling to answer questions ____________________________________________   PHYSICAL EXAM:  VITAL SIGNS: ED Triage Vitals  Enc Vitals Group     BP 08/05/20 1730 (!) 158/105     Pulse Rate 08/05/20 1730 (!) 123     Resp 08/05/20 1730 (!) 27     Temp 08/05/20 1911 98.4 F (36.9 C)     Temp Source 08/05/20 1911 Oral     SpO2 08/05/20 1730 100 %     Weight 08/05/20 1723 162 lb 0.6 oz (73.5 kg)     Height 08/05/20 1723 4\' 11"  (1.499 m)     Head Circumference --      Peak Flow --      Pain Score 08/05/20 1723 0     Pain Loc --      Pain Edu? --      Excl. in GC? --     Constitutional: Alert and oriented. Well appearing and in no acute distress. Eyes: Conjunctivae are normal. PERRL. Pupils 75mm Head: Atraumatic. Nose: No congestion/rhinnorhea. No epistaxis. Mouth/Throat: Mucous membranes are moist.  Neck: No stridor.   Hematological/Lymphatic/Immunilogical: No cervical lymphadenopathy. Cardiovascular: Tachycardic, regular rhythm. Grossly normal heart sounds.  Good peripheral circulation. Respiratory: Normal respiratory effort.  No retractions. Lungs CTAB. Gastrointestinal: Soft. No distention. No abdominal bruits.  Genitourinary:  Musculoskeletal: No lower extremity edema.  No joint effusions. Neurologic:  Normal speech and language with one word answers to some questions. Skin:  Skin is warm, dry and intact. No rash noted. Psychiatric: Anxious appearing. ____________________________________________   LABS (all labs ordered are listed, but only abnormal results are displayed)  Labs Reviewed  URINALYSIS, COMPLETE (UACMP) WITH MICROSCOPIC - Abnormal; Notable for the following components:      Result Value   Color, Urine COLORLESS (*)    APPearance CLEAR (*)    Specific Gravity, Urine 1.004 (*)    Hgb urine  dipstick SMALL (*)    Ketones, ur 20 (*)    All other components within normal limits  URINE DRUG SCREEN, QUALITATIVE ([facility] ONLY) - Abnormal; Notable for the following components:   Amphetamines, Ur Screen POSITIVE (*)    All other components within normal limits  COMPREHENSIVE METABOLIC PANEL - Abnormal; Notable for the following components:   Potassium 2.7 (*)    Chloride 97 (*)    Glucose, Bld 101 (*)    Total Protein 8.7 (*)    Albumin 5.1 (*)    AST 43 (*)    ALT 57 (*)    Alkaline Phosphatase 129 (*)    Total Bilirubin 1.5 (*)    All other components within normal limits  CBC WITH DIFFERENTIAL/PLATELET - Abnormal; Notable for the following  components:   WBC 11.1 (*)    RBC 5.26 (*)    Hemoglobin 15.1 (*)    Neutro Abs 8.4 (*)    All other components within normal limits  POC URINE PREG, ED - Normal  ETHANOL  TSH  HIV ANTIBODY (ROUTINE TESTING W REFLEX)  CBC  BASIC METABOLIC PANEL  CBC  POTASSIUM  TROPONIN I (HIGH SENSITIVITY)  TROPONIN I (HIGH SENSITIVITY)   ____________________________________________  EKG  ED ECG REPORT I, Trong Gosling, FNP-BC personally viewed and interpreted this ECG.   Date: 08/05/2020  EKG Time: 1729  Rate: 123  Rhythm: sinus tachycardia  Axis: normal  Intervals:none  ST&T Change: no ST elevation  ____________________________________________  RADIOLOGY  ED MD interpretation:    CT cervical spine and Head CT negative for acute findings.  I, Kem Boroughs, personally viewed and evaluated these images (plain radiographs) as part of my medical decision making, as well as reviewing the written report by the radiologist.  Official radiology report(s): CT Head Wo Contrast  Result Date: 08/05/2020 CLINICAL DATA:  Mental status changes EXAM: CT HEAD WITHOUT CONTRAST TECHNIQUE: Contiguous axial images were obtained from the base of the skull through the vertex without intravenous contrast. COMPARISON:  None. FINDINGS: Brain: No acute  intracranial abnormality. Specifically, no hemorrhage, hydrocephalus, mass lesion, acute infarction, or significant intracranial injury. Vascular: No hyperdense vessel or unexpected calcification. Skull: No acute calvarial abnormality. Sinuses/Orbits: Visualized paranasal sinuses and mastoids clear. Orbital soft tissues unremarkable. Other: None IMPRESSION: Normal study. Electronically Signed   By: Charlett Nose M.D.   On: 08/05/2020 18:06   CT Cervical Spine Wo Contrast  Result Date: 08/05/2020 CLINICAL DATA:  Neck pain following possible syncopal event EXAM: CT CERVICAL SPINE WITHOUT CONTRAST TECHNIQUE: Multidetector CT imaging of the cervical spine was performed without intravenous contrast. Multiplanar CT image reconstructions were also generated. COMPARISON:  07/24/2014 FINDINGS: Alignment: Normal. Skull base and vertebrae: 7 cervical segments are well visualized. Vertebral body height is well maintained. No acute fracture or acute facet abnormality is noted. Soft tissues and spinal canal: Surrounding soft tissue structures are within normal limits. Upper chest: Within normal limits. Other: None IMPRESSION: No acute abnormality in the cervical spine. Electronically Signed   By: Alcide Clever M.D.   On: 08/05/2020 18:08    ____________________________________________   PROCEDURES  Procedure(s) performed (including Critical Care):  Procedures  ____________________________________________   INITIAL IMPRESSION / ASSESSMENT AND PLAN     44 year old female presenting to the emergency department for treatment and evaluation of altered mental status.  See HPI for further details.  Patient does answer some questions with 1-2 words.  When she does answer, her speech is clear.  She communicates enough to report she is not in pain.  When asked if she can move her extremities she shakes her head no.  Plan will be to get labs, EKG, CT head and cervical spine, urinalysis and urine drug  screen.  DIFFERENTIAL DIAGNOSIS  Altered mental status, generalized weakness, psychiatric illness, head injury  ED COURSE  Patient with resolved tachycardia after 1 L of IV normal saline.  CT of her head and cervical spine are negative for acute findings.  She is hypokalemic at 2.7 and oral and IV potassium were given.  Urine drug screen is positive for amphetamines.  Urinalysis is indicative of a mild dehydration with 20 ketones.  Troponin is 3.  Patient continues to be unable or unwilling to move her extremities.  Will admit her for further evaluation.  Dr. Para Marchuncan with the hospitalist service has agreed to bring her in.    ___________________________________________   FINAL CLINICAL IMPRESSION(S) / ED DIAGNOSES  Final diagnoses:  Generalized weakness     ED Discharge Orders    None       Debbora PrestoHeather M Omar was evaluated in Emergency Department on 08/05/2020 for the symptoms described in the history of present illness. She was evaluated in the context of the global COVID-19 pandemic, which necessitated consideration that the patient might be at risk for infection with the SARS-CoV-2 virus that causes COVID-19. Institutional protocols and algorithms that pertain to the evaluation of patients at risk for COVID-19 are in a state of rapid change based on information released by regulatory bodies including the CDC and federal and state organizations. These policies and algorithms were followed during the patient's care in the ED.   Note:  This document was prepared using Dragon voice recognition software and may include unintentional dictation errors.   Chinita Pesterriplett, Rino Hosea B, FNP 08/05/20 2041    Dionne BucySiadecki, Sebastian, MD 08/05/20 2357

## 2020-08-05 NOTE — ED Notes (Signed)
Date and time results received: 08/05/20  , 6:30pm (use smartphrase ".now" to insert current time)  Test: Potasium  Critical Value: 2.7  Name of Provider Notified: NP Cari Beth   Orders Received? Or Actions Taken?

## 2020-08-05 NOTE — ED Notes (Addendum)
Pt states coming in for an episode of myasthina gravis. Pt states earlier she could not move or talk. Pt is able to talk. Pt states she is still unable to feel from the waist down.  Pt refused pain medication at this time.

## 2020-08-05 NOTE — ED Notes (Signed)
Pt in CT.

## 2020-08-06 DIAGNOSIS — R0682 Tachypnea, not elsewhere classified: Secondary | ICD-10-CM

## 2020-08-06 DIAGNOSIS — I10 Essential (primary) hypertension: Secondary | ICD-10-CM | POA: Diagnosis not present

## 2020-08-06 DIAGNOSIS — E876 Hypokalemia: Secondary | ICD-10-CM

## 2020-08-06 DIAGNOSIS — R945 Abnormal results of liver function studies: Secondary | ICD-10-CM | POA: Diagnosis not present

## 2020-08-06 DIAGNOSIS — M797 Fibromyalgia: Secondary | ICD-10-CM

## 2020-08-06 DIAGNOSIS — R4182 Altered mental status, unspecified: Secondary | ICD-10-CM

## 2020-08-06 DIAGNOSIS — G9341 Metabolic encephalopathy: Secondary | ICD-10-CM | POA: Diagnosis not present

## 2020-08-06 DIAGNOSIS — R531 Weakness: Secondary | ICD-10-CM | POA: Diagnosis not present

## 2020-08-06 LAB — HEPATITIS PANEL, ACUTE
HCV Ab: NONREACTIVE
Hep A IgM: NONREACTIVE
Hep B C IgM: NONREACTIVE
Hepatitis B Surface Ag: NONREACTIVE

## 2020-08-06 LAB — CBC
HCT: 36.9 % (ref 36.0–46.0)
Hemoglobin: 12.7 g/dL (ref 12.0–15.0)
MCH: 29.1 pg (ref 26.0–34.0)
MCHC: 34.4 g/dL (ref 30.0–36.0)
MCV: 84.4 fL (ref 80.0–100.0)
Platelets: 249 10*3/uL (ref 150–400)
RBC: 4.37 MIL/uL (ref 3.87–5.11)
RDW: 13.1 % (ref 11.5–15.5)
WBC: 7.4 10*3/uL (ref 4.0–10.5)
nRBC: 0 % (ref 0.0–0.2)

## 2020-08-06 LAB — T4, FREE: Free T4: 1.05 ng/dL (ref 0.61–1.12)

## 2020-08-06 LAB — BASIC METABOLIC PANEL
Anion gap: 10 (ref 5–15)
BUN: 8 mg/dL (ref 6–20)
CO2: 22 mmol/L (ref 22–32)
Calcium: 8.4 mg/dL — ABNORMAL LOW (ref 8.9–10.3)
Chloride: 103 mmol/L (ref 98–111)
Creatinine, Ser: 0.63 mg/dL (ref 0.44–1.00)
GFR, Estimated: >60 mL/min (ref >60)
Glucose, Bld: 154 mg/dL — ABNORMAL HIGH (ref 70–99)
Potassium: 3.6 mmol/L (ref 3.5–5.1)
Sodium: 135 mmol/L (ref 135–145)

## 2020-08-06 LAB — HIV ANTIBODY (ROUTINE TESTING W REFLEX): HIV Screen 4th Generation wRfx: NONREACTIVE

## 2020-08-06 LAB — RESPIRATORY PANEL BY RT PCR (FLU A&B, COVID)
Influenza A by PCR: NEGATIVE
Influenza B by PCR: NEGATIVE
SARS Coronavirus 2 by RT PCR: NEGATIVE

## 2020-08-06 MED ORDER — LORAZEPAM 1 MG PO TABS: 1.0000 mg | ORAL_TABLET | Freq: Four times a day (QID) | ORAL | Status: DC | PRN

## 2020-08-06 NOTE — Evaluation (Signed)
Physical Therapy Evaluation Patient Details Name: PRESCILLA MONGER MRN: [id number] DOB: Sep 01, 1976 Today's Date: 08/06/2020   History of Present Illness  Patient is a 44 y.o. female with history of hypertension and optic neuritis presents to the emergency department for treatment and evaluation of generalized weakness. CT cervical spine and Head CT negative for acute findings. Patient self reports history of myasthenia gravis although  does not appear that she was ever definitively diagnosed with myasthenia gravis per notes.   Clinical Impression  Patient agreeable to physical therapy evaluation. Patient reports she feels she is improving with symptoms. No focal weakness is noted with LE exam with functional observation and patient able to weight bear without knee buckling. Patient reports improving numbness in BLE, however this did not appear to impact functional independence with mobility. Patient is independent with bed mobility and transfers. Patient is Modified independent with ambulating 148ft in hallway and around her room using rolling walker used per her request. High level balance activites completed as patient challenged outside base of support with head turning without UE support, walking backwards using rolling walker for support, reaching outside base of support without UE support, and functional ambulation using rolling walker. No loss of balance is demonstrated with functional activity. No further acute PT needs at this time. Evaluation and treatment completed.  Of note, orthostatic vitals completed also: supine- 135/56mmHg, sitting- 136/43mmHg, and standing 141/95 mmHg    Follow Up Recommendations No PT follow up    Equipment Recommendations  None recommended by PT    Recommendations for Other Services       Precautions / Restrictions Precautions Precautions: Fall Restrictions Weight Bearing Restrictions: No      Mobility  Bed Mobility Overal bed mobility: Independent                 Transfers Overall transfer level: Independent               General transfer comment: sit to and from standing from stretcher and from toilet   Ambulation/Gait Ambulation/Gait assistance: Modified independent (Device/Increase time) Gait Distance (Feet): 150 Feet Assistive device: Rolling walker (2 wheeled) Gait Pattern/deviations: WFL(Within Functional Limits)     General Gait Details: patient ambulated in hallway and around room without difficulty using rolling walker per patient request. no difficulty negotiating turns or obstacles  Stairs            Wheelchair Mobility    Modified Rankin (Stroke Patients Only)       Balance Overall balance assessment: Needs assistance;History of Falls         Standing balance support: No upper extremity supported;During functional activity Standing balance-Leahy Scale: Good Standing balance comment: patient is able to reach outside base of support with bilateral UE without loss of balance. patient prefers to use rolling walker with ambulation for safety and fall prevention              High level balance activites: Side stepping;Direction changes;Turns;Backward walking High Level Balance Comments: treatment included high level balance activities listed above with patient variations of no UE support, unilateral UE support, or bilateral UE support of rolling walker. Mod I with no loss of balance noted and good trunk control              Pertinent Vitals/Pain Pain Location: vague report about chronic pain "all over" Pain Descriptors / Indicators: Constant Pain Intervention(s): Other (comment);Monitored during session (pain does not appear to impact functional independence. )    Home Living  Family/patient expects to be discharged to:: Private residence Living Arrangements: Children;Spouse/significant other Available Help at Discharge: Family;Available PRN/intermittently Type of Home: House Home  Access: Stairs to enter Entrance Stairs-Rails: Lawyer of Steps: 5 Home Layout: One level Home Equipment: Walker - 2 wheels;Cane - single point      Prior Function Level of Independence: Independent         Comments: patient reports occasional use of rolling walker or cane on days when she does not feel well      Hand Dominance   Dominant Hand: Right    Extremity/Trunk Assessment   Upper Extremity Assessment Upper Extremity Assessment: LUE deficits/detail;RUE deficits/detail RUE Deficits / Details: WFL  RUE Sensation: WNL LUE Deficits / Details: 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 did not formally MMT, but AROM appears WFL with strong grip strength  LUE Sensation: WNL    Lower Extremity Assessment Lower Extremity Assessment: RLE deficits/detail;LLE deficits/detail RLE Deficits / Details: patient able to complete SLR and stand without knee buckling without assistance  RLE Sensation: decreased light touch (patient self reports) LLE Deficits / Details: patient able to complete SLR and stand without knee buckling without assistance  LLE Sensation: decreased light touch (patient self reports )       Communication   Communication: No difficulties  Cognition Arousal/Alertness: Awake/alert Behavior During Therapy: WFL for tasks assessed/performed Overall Cognitive Status: Within Functional Limits for tasks assessed                                        General Comments      Exercises     Assessment/Plan    PT Assessment Patent does not need any further PT services  PT Problem List         PT Treatment Interventions      PT Goals (Current goals can be found in the Care Plan section)  Acute Rehab PT Goals Patient Stated Goal: to go home  PT Goal Formulation: With patient Time For Goal Achievement: 08/06/20 Potential to Achieve Goals: Good    Frequency     Barriers to discharge         Co-evaluation               AM-PAC PT "6 Clicks" Mobility  Outcome Measure Help needed turning from your back to your side while in a flat bed without using bedrails?: None Help needed moving from lying on your back to sitting on the side of a flat bed without using bedrails?: None Help needed moving to and from a bed to a chair (including a wheelchair)?: None Help needed standing up from a chair using your arms (e.g., wheelchair or bedside chair)?: None Help needed to walk in hospital room?: None Help needed climbing 3-5 steps with a railing? : None 6 Click Score: 24    End of Session Equipment Utilized During Treatment: Gait belt Activity Tolerance: Patient tolerated treatment well Patient left: in bed Nurse Communication: Mobility status PT Visit Diagnosis: Muscle weakness (generalized) (M62.81);Unsteadiness on feet (R26.81)    Time: 9179-1505 PT Time Calculation (min) (ACUTE ONLY): 38 min   Charges:   PT Evaluation $PT Eval Moderate Complexity: 1 Mod PT Treatments $Neuromuscular Re-education: 23-37 mins       Donna Bernard, PT, MPT   Ina Homes 08/06/2020, 10:54 AM

## 2020-08-06 NOTE — ED Notes (Signed)
RN called and Lab stated they will come and get 5am morning labs on pt.  

## 2020-08-06 NOTE — ED Notes (Signed)
Discharge covered with patient, including followups. Signature pad unable to capture signature, patient verbally agrees to discharge

## 2020-08-06 NOTE — Discharge Summary (Signed)
Physician Discharge Summary [facility]    Patient: Diana Mcintosh                   Admit date: 08/05/2020   DOB: 01-03-1976             Discharge date:08/06/2020/4:33 PM WIO:[id number]                          PCP: Pcp, No  Disposition: HOME  Recommendations for Outpatient Follow-up:   Follow up: Follow-up with neurology--- within 1 week  Discharge Condition: Stable   Code Status:   Code Status: Full Code  Diet recommendation: Regular healthy diet   Discharge Diagnoses:    Active Problems:   Essential hypertension   Hypothyroidism, postablative   Hypokalemia   Fibromyalgia   Weakness   Abnormal LFTs   Acute metabolic encephalopathy   SIRS (systemic inflammatory response syndrome) (HCC)   History of Present Illness/ Hospital Course Kathleen Argue Summary:  Chief Complaint: Weakness  HPI: Diana Mcintosh is a 44 y.o. female with medical history significant for hypertension, post ablative hypothyroidism, fibromyalgia, history of weakness and paresthesia  with prior neurologic evaluation at [facility] and [facility] to evaluate for MS and myasthenia though no such final diagnosis noted on chart review who presents to the emergency room after EMS was called out for unresponsiveness.  Family reported that patient has been running errands throughout the day and came home and laid down but then became very lethargic.  Patient with no awake and alert after several hours in the emergency room states that she has a diagnosis of myasthenia gravis and this is what it felt like the last time she had a flareup.  At the time of my evaluation she states that she is unable to move her legs and unable to urinate and while in the emergency room was straight cathed a couple times. ED Course: On arrival, patient was lethargic, tachycardic at 124, tachypneic at 27 afebrile, BP 158/105.  WBC 11.1, Hb 15, potassium 2.7 with mild elevation in LFTs, AST/ALT 43/57, alk phos 129, total bili 1.5.  Urinalysis  with mild ketones of 20, EtOH less than 10.  UDS positive amphetamines.  CT head and C-spine unremarkable.  EKG as reviewed by me: Sinus tach at 123 with no acute ST-T wave changes    Lower extremity/generalized weakness   -Patient was seen and examined this morning, symptoms has completely resolved, no relapse episodes Records in epic was reviewed, labs were reviewed   -head CT with no acute intracranial findings.  CT C-spine with no spinal cord abnormalities -Consider lumbar imaging -Saw [facility] neurology in September 2020.  Etiology of neurologic symptoms were undetermined at that time based on my interpretation and further work-up was in the works -Inpatient neurology was consulted patient has been seen by [facility]   Neurology recommendation recommendations: 1.  Recommended PT evaluation-completed cleared the patient 2. Outpatient Neurology follow up. Outpatient Neurologist to consider obtaining an NMO Ab level and cervical spine MRI study. May also need an MRI of the orbits.  3. Will need regular scheduled Ophthalmology follow ups for her optic neuritis.    Hypokalemia -Likely related to home thiazide diuretic -Was repleted... Potassium improved to 3.6  Elevated LFTs -Mild AST ALT elevation as well as alk phos and total bilirubin but without complaints of pain -hepatitis panel... All nonreactive HIV nonreactive -Tylenol and ammonia levels normal  Acute metabolic encephalopathy -Resolved.  EMS  was called out for unresponsiveness.  -Head CT negative--- reviewed -UDS positive for amphetamines.  EtOH below 10 -Ammonia and acetaminophen levels within normal limits  SIRS -Patient with altered mental status, tachycardia of 120 and tachypnea of 27 with mild leukocytosis though afebrile and without localizing physical exam -Lactic acid and D-dimer normal  -No signs of infection    Essential hypertension -Continue home antihypertensives, benazepril    Hypothyroidism,  postablative -Continue home levothyroxine    Fibromyalgia -On meloxicam which we will hold for now     DVT prophylaxis: Lovenox  Code Status: full code  Family Communication:  none  Disposition Plan: Back to previous home environment--   cleared to be discharged home today Consults called:  Neurology Status: Observation     Discharge Instructions:   Discharge Instructions    Activity as tolerated - No restrictions   Complete by: As directed    Diet - low sodium heart healthy   Complete by: As directed    Discharge instructions   Complete by: As directed    Follow-up with primary neurologist soon as possible and PCP   Increase activity slowly   Complete by: As directed        Medication List    TAKE these medications   albuterol 108 (90 Base) MCG/ACT inhaler Commonly known as: VENTOLIN HFA Inhale 1 puff into the lungs every 6 (six) hours as needed for wheezing.   amLODipine 5 MG tablet Commonly known as: NORVASC Take 5 mg by mouth daily.   amphetamine-dextroamphetamine 20 MG tablet Commonly known as: ADDERALL Take 10-20 mg by mouth 2 (two) times daily. 20 mg in the morning and 10 mg in the evening   benazepril 10 MG tablet Commonly known as: LOTENSIN Take 10 mg by mouth daily.   Calcium 600+D 600-800 MG-UNIT Tabs Generic drug: Calcium Carb-Cholecalciferol Take 3 tablets by mouth at bedtime.   citalopram 20 MG tablet Commonly known as: CELEXA Take 20 mg by mouth daily.   cyclobenzaprine 10 MG tablet Commonly known as: FLEXERIL Take 10 mg by mouth at bedtime.   hydrochlorothiazide 50 MG tablet Commonly known as: HYDRODIURIL Take 50 mg by mouth daily.   levothyroxine 88 MCG tablet Commonly known as: SYNTHROID Take 88 mcg by mouth daily before breakfast.   liothyronine 5 MCG tablet Commonly known as: CYTOMEL Take 5 mcg by mouth daily.   meloxicam 7.5 MG tablet Commonly known as: MOBIC Take 7.5 mg by mouth 2 (two) times daily.   Potassium  Chloride ER 20 MEQ Tbcr Take 20 mEq by mouth at bedtime.   Symbicort 160-4.5 MCG/ACT inhaler Generic drug: budesonide-formoterol Inhale 2 puffs into the lungs daily.       Allergies  Allergen Reactions  . Codeine Anaphylaxis  . Darvon [Propoxyphene] Anaphylaxis    Darvocet  . Dilaudid [Hydromorphone Hcl] Anaphylaxis  . Doxycycline Anaphylaxis  . Keflex [Cephalexin] Anaphylaxis  . Morphine And Related Anaphylaxis  . Other Anaphylaxis    Strawberries  . Percocet [Oxycodone-Acetaminophen] Anaphylaxis  . Vicodin [Hydrocodone-Acetaminophen] Anaphylaxis  . Toradol [Ketorolac Tromethamine]      Procedures /Studies:   CT Head Wo Contrast  Result Date: 08/05/2020 CLINICAL DATA:  Mental status changes EXAM: CT HEAD WITHOUT CONTRAST TECHNIQUE: Contiguous axial images were obtained from the base of the skull through the vertex without intravenous contrast. COMPARISON:  None. FINDINGS: Brain: No acute intracranial abnormality. Specifically, no hemorrhage, hydrocephalus, mass lesion, acute infarction, or significant intracranial injury. Vascular: No hyperdense vessel or unexpected calcification. Skull:  No acute calvarial abnormality. Sinuses/Orbits: Visualized paranasal sinuses and mastoids clear. Orbital soft tissues unremarkable. Other: None IMPRESSION: Normal study. Electronically Signed   By: Rolm Baptise M.D.   On: 08/05/2020 18:06   CT Cervical Spine Wo Contrast  Result Date: 08/05/2020 CLINICAL DATA:  Neck pain following possible syncopal event EXAM: CT CERVICAL SPINE WITHOUT CONTRAST TECHNIQUE: Multidetector CT imaging of the cervical spine was performed without intravenous contrast. Multiplanar CT image reconstructions were also generated. COMPARISON:  07/24/2014 FINDINGS: Alignment: Normal. Skull base and vertebrae: 7 cervical segments are well visualized. Vertebral body height is well maintained. No acute fracture or acute facet abnormality is noted. Soft tissues and spinal canal:  Surrounding soft tissue structures are within normal limits. Upper chest: Within normal limits. Other: None IMPRESSION: No acute abnormality in the cervical spine. Electronically Signed   By: Inez Catalina M.D.   On: 08/05/2020 18:08   DG Chest Port 1 View  Result Date: 08/05/2020 CLINICAL DATA:  Tachypnea EXAM: PORTABLE CHEST 1 VIEW COMPARISON:  None. FINDINGS: Heart and mediastinal contours are within normal limits. No focal opacities or effusions. No acute bony abnormality. IMPRESSION: No active disease. Electronically Signed   By: Rolm Baptise M.D.   On: 08/05/2020 21:21    Subjective:   Patient was seen and examined 08/06/2020, 4:33 PM Patient stable today. No acute distress.  No issues overnight Stable for discharge.  Discharge Exam:    Vitals:   08/06/20 1415 08/06/20 1445 08/06/20 1515 08/06/20 1545  BP:      Pulse: 96 (!) 101 80 81  Resp: _0 Temp:      TempSrc:      SpO2: 99% 96% 99% 94%  Weight:      Height:        General: Pt lying comfortably in bed & appears in no obvious distress. Cardiovascular: S1 & S2 heard, RRR, S1/S2 +. No murmurs, rubs, gallops or clicks. No JVD or pedal edema. Respiratory: Clear to auscultation without wheezing, rhonchi or crackles. No increased work of breathing. Abdominal:  Non-distended, non-tender & soft. No organomegaly or masses appreciated. Normal bowel sounds heard. CNS: Alert and oriented. No focal deficits. Extremities: no edema, no cyanosis    The results of significant diagnostics from this hospitalization (including imaging, microbiology, ancillary and laboratory) are listed below for reference.      Microbiology:   Recent Results (from the past 240 hour(s))  Respiratory Panel by RT PCR (Flu A&B, Covid) - Nasopharyngeal Swab     Status: None   Collection Time: 08/05/20  5:32 PM   Specimen: Nasopharyngeal Swab  Result Value Ref Range Status   SARS Coronavirus 2 by RT PCR NEGATIVE NEGATIVE Final    Comment:  (NOTE) SARS-CoV-2 target nucleic acids are NOT DETECTED.  The SARS-CoV-2 RNA is generally detectable in upper respiratoy specimens during the acute phase of infection. The lowest concentration of SARS-CoV-2 viral copies this assay can detect is 131 copies/mL. A negative result does not preclude SARS-Cov-2 infection and should not be used as the sole basis for treatment or other patient management decisions. A negative result may occur with  improper specimen collection/handling, submission of specimen other than nasopharyngeal swab, presence of viral mutation(s) within the areas targeted by this assay, and inadequate number of viral copies (<131 copies/mL). A negative result must be combined with clinical observations, patient history, and epidemiological information. The expected result is Negative.  Fact Sheet for Patients:  PinkCheek.be  Fact Sheet for  Healthcare Providers:  GravelBags.it  This test is no t yet approved or cleared by the [place] and  has been authorized for detection and/or diagnosis of SARS-CoV-2 by FDA under an Emergency Use Authorization (EUA). This EUA will remain  in effect (meaning this test can be used) for the duration of the COVID-19 declaration under Section 564(b)(1) of the Act, 21 U.S.C. section 360bbb-3(b)(1), unless the authorization is terminated or revoked sooner.     Influenza A by PCR NEGATIVE NEGATIVE Final   Influenza B by PCR NEGATIVE NEGATIVE Final    Comment: (NOTE) The Xpert Xpress SARS-CoV-2/FLU/RSV assay is intended as an aid in  the diagnosis of influenza from Nasopharyngeal swab specimens and  should not be used as a sole basis for treatment. Nasal washings and  aspirates are unacceptable for Xpert Xpress SARS-CoV-2/FLU/RSV  testing.  Fact Sheet for Patients: PinkCheek.be  Fact Sheet for Healthcare  Providers: GravelBags.it  This test is not yet approved or cleared by the [place] FDA and  has been authorized for detection and/or diagnosis of SARS-CoV-2 by  FDA under an Emergency Use Authorization (EUA). This EUA will remain  in effect (meaning this test can be used) for the duration of the  Covid-19 declaration under Section 564(b)(1) of the Act, 21  U.S.C. section 360bbb-3(b)(1), unless the authorization is  terminated or revoked. Performed at [facility], [address] [place], [place] [address]      Labs:   CBC: Recent Labs  Lab 08/05/20 1732 08/06/20 0152  WBC 11.1* 7.4  NEUTROABS 8.4*  --   HGB 15.1* 12.7  HCT 44.0 36.9  MCV 83.7 84.4  PLT 298 568   Basic Metabolic Panel: Recent Labs  Lab 08/05/20 1732 08/05/20 2134 08/06/20 0152  NA 137  --  135  K 2.7* 3.0* 3.6  CL 97*  --  103  CO2 26  --  22  GLUCOSE 101*  --  154*  BUN 9  --  8  CREATININE 0.60  --  0.63  CALCIUM 10.1  --  8.4*   Liver Function Tests: Recent Labs  Lab 08/05/20 1732  AST 43*  ALT 57*  ALKPHOS 129*  BILITOT 1.5*  PROT 8.7*  ALBUMIN 5.1*   BNP (last 3 results) No results for input(s): BNP in the last 8760 hours. Cardiac Enzymes: No results for input(s): CKTOTAL, CKMB, CKMBINDEX, TROPONINI in the last 168 hours. CBG: No results for input(s): GLUCAP in the last 168 hours. Hgb A1c No results for input(s): HGBA1C in the last 72 hours. Lipid Profile No results for input(s): CHOL, HDL, LDLCALC, TRIG, CHOLHDL, LDLDIRECT in the last 72 hours. Thyroid function studies Recent Labs    08/05/20 2009  TSH 0.244*   Anemia work up No results for input(s): VITAMINB12, FOLATE, FERRITIN, TIBC, IRON, RETICCTPCT in the last 72 hours. Urinalysis    Component Value Date/Time   COLORURINE COLORLESS (A) 08/05/2020 1850   APPEARANCEUR CLEAR (A) 08/05/2020 1850   LABSPEC 1.004 (L) 08/05/2020 1850   PHURINE 8.0 08/05/2020 1850   GLUCOSEU  NEGATIVE 08/05/2020 1850   HGBUR SMALL (A) 08/05/2020 1850   BILIRUBINUR NEGATIVE 08/05/2020 1850   KETONESUR 20 (A) 08/05/2020 1850   PROTEINUR NEGATIVE 08/05/2020 1850   NITRITE NEGATIVE 08/05/2020 1850   LEUKOCYTESUR NEGATIVE 08/05/2020 1850         Time coordinating discharge: Over 45 minutes  SIGNED: Deatra James, MD, FACP, FHM. [facility],  Please use [email or web address] to Page  If 7PM-7AM, please contact night-coverage [email or web address] 08/06/2020, 4:33 PM

## 2020-08-06 NOTE — ED Notes (Signed)
Diana Mcintosh, RNs did in and out cath
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
## 2020-08-07 LAB — T3, FREE: T3, Free: 2.7 pg/mL (ref 2.0–4.4)

## 2021-03-15 ENCOUNTER — Encounter: Payer: Self-pay | Admitting: Emergency Medicine

## 2021-03-15 ENCOUNTER — Ambulatory Visit
Admission: EM | Admit: 2021-03-15 | Discharge: 2021-03-15 | Disposition: A | Discharge status: Home / Self Care | Payer: Medicaid Other | Attending: Physician Assistant | Admitting: Physician Assistant

## 2021-03-15 ENCOUNTER — Other Ambulatory Visit: Payer: Self-pay

## 2021-03-15 DIAGNOSIS — Z8616 Personal history of COVID-19: Secondary | ICD-10-CM | POA: Insufficient documentation

## 2021-03-15 DIAGNOSIS — R059 Cough, unspecified: Secondary | ICD-10-CM | POA: Insufficient documentation

## 2021-03-15 DIAGNOSIS — G7000 Myasthenia gravis without (acute) exacerbation: Secondary | ICD-10-CM | POA: Insufficient documentation

## 2021-03-15 DIAGNOSIS — Z791 Long term (current) use of non-steroidal anti-inflammatories (NSAID): Secondary | ICD-10-CM | POA: Diagnosis not present

## 2021-03-15 DIAGNOSIS — Z28310 Unvaccinated for covid-19: Secondary | ICD-10-CM | POA: Insufficient documentation

## 2021-03-15 DIAGNOSIS — Z881 Allergy status to other antibiotic agents status: Secondary | ICD-10-CM | POA: Diagnosis not present

## 2021-03-15 DIAGNOSIS — J029 Acute pharyngitis, unspecified: Secondary | ICD-10-CM | POA: Diagnosis not present

## 2021-03-15 DIAGNOSIS — Z79899 Other long term (current) drug therapy: Secondary | ICD-10-CM | POA: Insufficient documentation

## 2021-03-15 DIAGNOSIS — H66001 Acute suppurative otitis media without spontaneous rupture of ear drum, right ear: Secondary | ICD-10-CM | POA: Insufficient documentation

## 2021-03-15 DIAGNOSIS — I10 Essential (primary) hypertension: Secondary | ICD-10-CM | POA: Insufficient documentation

## 2021-03-15 DIAGNOSIS — Z20822 Contact with and (suspected) exposure to covid-19: Secondary | ICD-10-CM | POA: Insufficient documentation

## 2021-03-15 DIAGNOSIS — Z7901 Long term (current) use of anticoagulants: Secondary | ICD-10-CM | POA: Insufficient documentation

## 2021-03-15 DIAGNOSIS — J069 Acute upper respiratory infection, unspecified: Secondary | ICD-10-CM

## 2021-03-15 LAB — GROUP A STREP BY PCR: Group A Strep by PCR: NOT DETECTED

## 2021-03-15 MED ORDER — AZITHROMYCIN 250 MG PO TABS: 250.0000 mg | ORAL_TABLET | Freq: Every day | ORAL | 0 refills | Status: AC

## 2021-03-15 MED ORDER — BENZONATATE 200 MG PO CAPS: 200.0000 mg | ORAL_CAPSULE | Freq: Three times a day (TID) | ORAL | 0 refills | Status: AC | PRN

## 2021-03-15 NOTE — Discharge Instructions (Signed)

## 2021-03-15 NOTE — ED Triage Notes (Signed)
Patient c/o cough, chest congestion, nasal congestion and sore throat that started on Monday.  Patient reports loss of smell today.  Patient denies fevers.

## 2021-03-15 NOTE — ED Provider Notes (Signed)
[facility]    CSN: [id number] Arrival date & time: 03/15/21  1905      History   Chief Complaint Chief Complaint  Patient presents with  . Cough  . Nasal Congestion  . Sore Throat    HPI Diana Mcintosh is a 45 y.o. female presenting for 2 to 3-day history of fatigue, cough, chest congestion, nasal congestion, sore throat and bilateral ear pain.  Patient says her son is sick with similar symptoms but she denies any known COVID exposure to her knowledge.  She has no fevers.  She denies any sinus pain, chest pain, breathing difficulty, abdominal pain, nausea/vomiting or diarrhea.  Patient has not been vaccinated for COVID-19.  She has been taking over-the-counter decongestants and using nasal sprays and nasal saline rinses for symptoms.  Patient says she has a lot of medical problems including history of hypertension, SIRS and myasthenia gravis.  Patient has also had recurrent/chronic pancreatitis related to previous COVID-19 infection last year.  She has no other concerns today.  HPI  Past Medical History:  Diagnosis Date  . Back pain   . Hypertension   . Neck pain   . Seasonal allergies   . Shoulder pain   . Thyroid disease     Patient Active Problem List   Diagnosis Date Noted  . Weakness 08/05/2020  . Abnormal LFTs 08/05/2020  . Acute metabolic encephalopathy 08/05/2020  . SIRS (systemic inflammatory response syndrome) (HCC) 08/05/2020  . Myasthenia gravis (HCC)   . Essential hypertension 07/05/2015  . Hypokalemia 05/24/2015  . 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 Fibromyalgia 08/29/2013    Past Surgical History:  Procedure Laterality Date  . ABDOMINAL HYSTERECTOMY    . ABDOMINAL SURGERY    . BREAST SURGERY    . HAND SURGERY     5 surgeries on right hand 2 on left hand  . nose repair    . TONSILLECTOMY      OB History   No obstetric history on file.      Home Medications    Prior to  Admission medications   Medication Sig Start Date End Date Taking? Authorizing Provider  amLODipine (NORVASC) 5 MG tablet Take 5 mg by mouth daily.    Yes [provider]  amphetamine-dextroamphetamine (ADDERALL) 20 MG tablet Take 10-20 mg by mouth 2 (two) times daily. 20 mg in the morning and 10 mg in the evening 07/19/20  Yes [provider]  azithromycin (ZITHROMAX) 250 MG tablet Take 1 tablet (250 mg total) by mouth daily. Take first 2 tablets together, then 1 every day until finished. 03/15/21  Yes Eusebio FriendlyEaves, Pinchos Topel B, PA-C  benazepril (LOTENSIN) 10 MG tablet Take 10 mg by mouth daily. 07/20/20  Yes [provider]  benzonatate (TESSALON) 200 MG capsule Take 1 capsule (200 mg total) by mouth 3 (three) times daily as needed for up to 7 days for cough. 03/15/21 03/22/21 Yes Shirlee LatchEaves, Hina Gupta B, PA-C  Calcium Carb-Cholecalciferol (CALCIUM 600+D) 600-800 MG-UNIT TABS Take 3 tablets by mouth at bedtime.   Yes [provider]  citalopram (CELEXA) 20 MG tablet Take 20 mg by mouth daily.   Yes [provider]  cyclobenzaprine (FLEXERIL) 10 MG tablet Take 10 mg by mouth at bedtime.   Yes [provider]  hydrochlorothiazide (HYDRODIURIL) 50 MG tablet Take 50 mg by mouth daily.    Yes [provider]  levothyroxine (SYNTHROID, LEVOTHROID) 88 MCG tablet Take 88 mcg by  mouth daily before breakfast.   Yes [provider]  liothyronine (CYTOMEL) 5 MCG tablet Take 5 mcg by mouth daily.   Yes [provider]  meloxicam (MOBIC) 7.5 MG tablet Take 7.5 mg by mouth 2 (two) times daily. 07/17/20  Yes [provider]  Potassium Chloride ER 20 MEQ TBCR Take 20 mEq by mouth at bedtime.  07/05/20  Yes [provider]  SYMBICORT 160-4.5 MCG/ACT inhaler Inhale 2 puffs into the lungs daily.  07/22/20  Yes [provider]  albuterol (VENTOLIN HFA) 108 (90 Base) MCG/ACT inhaler Inhale 1 puff into the lungs every 6 (six) hours as needed  for wheezing. 02/08/20   [provider]    Family History Family History  Problem Relation Age of Onset  . Hypertension Mother   . Stroke Mother   . Cancer Mother   . Asthma Mother   . Hypertension Father   . Stroke Father     Social History Social History   Tobacco Use  . Smoking status: Never Smoker  . Smokeless tobacco: Never Used  Vaping Use  . Vaping Use: Never used  Substance Use Topics  . Alcohol use: No  . Drug use: No     Allergies   Codeine, Darvon [propoxyphene], Dilaudid [hydromorphone hcl], Doxycycline, Keflex [cephalexin], Morphine and related, Other, Percocet [oxycodone-acetaminophen], Vicodin [hydrocodone-acetaminophen], and Toradol [ketorolac tromethamine]   Review of Systems Review of Systems  Constitutional: Positive for fatigue. Negative for chills, diaphoresis and fever.  HENT: Positive for congestion, ear pain and rhinorrhea. Negative for sinus pressure, sinus pain and sore throat.   Respiratory: Positive for cough. Negative for shortness of breath.   Cardiovascular: Negative for chest pain.  Gastrointestinal: Negative for abdominal pain, nausea and vomiting.  Musculoskeletal: Negative for arthralgias and myalgias.  Skin: Negative for rash.  Neurological: Positive for headaches. Negative for weakness.  Hematological: Negative for adenopathy.     Physical Exam Triage Vital Signs ED Triage Vitals  Enc Vitals Group     BP 03/15/21 1924 (!) 124/99     Pulse Rate 03/15/21 1924 (!) 106     Resp 03/15/21 1924 14     Temp 03/15/21 1924 98.2 F (36.8 C)     Temp Source 03/15/21 1924 Oral     SpO2 03/15/21 1924 97 %     Weight 03/15/21 1922 160 lb (72.6 kg)     Height 03/15/21 1922  (1.499 m)     Head Circumference --      Peak Flow --      Pain Score 03/15/21 1922 9     Pain Loc --      Pain Edu? --      Excl. in GC? --    No data found.  Updated Vital Signs BP (!) 124/99 (BP Location: Left Arm)   Pulse (!) 106   Temp  98.2 F (36.8 C) (Oral)   Resp 14   Ht  (1.499 m)   Wt 160 lb (72.6 kg)   SpO2 97%   BMI 32.32 kg/m    Physical Exam Vitals and nursing note reviewed.  Constitutional:      General: She is not in acute distress.    Appearance: Normal appearance. She is not ill-appearing or toxic-appearing.  HENT:     Head: Normocephalic and atraumatic.     Right Ear: Ear canal and external ear normal. Tympanic membrane is erythematous and bulging.     Left Ear: Ear canal and external  ear normal. A middle ear effusion is present.     Nose: Congestion and rhinorrhea present.     Mouth/Throat:     Mouth: Mucous membranes are moist.     Pharynx: Oropharynx is clear.  Eyes:     General: No scleral icterus.       Right eye: No discharge.        Left eye: No discharge.     Conjunctiva/sclera: Conjunctivae normal.  Cardiovascular:     Rate and Rhythm: Normal rate and regular rhythm.     Heart sounds: Normal heart sounds.  Pulmonary:     Effort: Pulmonary effort is normal. No respiratory distress.     Breath sounds: Normal breath sounds. No wheezing, rhonchi or rales.  Musculoskeletal:     Cervical back: Neck supple.  Skin:    General: Skin is dry.  Neurological:     General: No focal deficit present.     Mental Status: She is alert. Mental status is at baseline.     Motor: No weakness.     Gait: Gait normal.  Psychiatric:        Mood and Affect: Mood normal.        Behavior: Behavior normal.        Thought Content: Thought content normal.      UC Treatments / Results  Labs (all labs ordered are listed, but only abnormal results are displayed) Labs Reviewed  GROUP A STREP BY PCR  SARS CORONAVIRUS 2 (TAT 6-24 HRS)    EKG   Radiology No results found.  Procedures Procedures (including critical care time)  Medications Ordered in UC Medications - No data to display  Initial Impression / Assessment and Plan / UC Course  I have reviewed the triage vital signs and the  nursing notes.  Pertinent labs & imaging results that were available during my care of the patient were reviewed by me and considered in my medical decision making (see chart for details).   45 year old female presenting for 2 to 3-day history of bilateral ear pain, sore throat, cough, and congestion.  Also with the fatigue.  Her exam does reveal right-sided erythema and bulging TM.  She also has nasal congestion and mild posterior pharyngeal erythema.  Chest is clear to auscultation heart regular rate and rhythm.  Molecular strep test is negative.  COVID-19 test performed.  Current CDC guidelines, isolation protocol and ED precautions reviewed with patient.  Treating her ear infection with azithromycin at this time given she has a long list of allergies.  Additionally, sending in benzonatate.  She says she is taking this for cough before and that has been helpful.  Advised her to follow-up with [place] as needed for any worsening symptoms or she is not improving over the next 1 week.   Final Clinical Impressions(s) / UC Diagnoses   Final diagnoses:  Upper respiratory tract infection, unspecified type  Acute suppurative otitis media of right ear without spontaneous rupture of tympanic membrane, recurrence not specified  Cough     Discharge Instructions     You have received COVID testing today either for positive exposure, concerning symptoms that could be related to COVID infection, screening purposes, or re-testing after confirmed positive.  Your test obtained today checks for active viral infection in the last 1-2 weeks. If your test is negative now, you can still test positive later. So, if you do develop symptoms you should either get re-tested and/or isolate x 5 days and then strict mask  use x 5 days (unvaccinated) or mask use x 10 days (vaccinated). Please follow CDC guidelines.  While Rapid antigen tests come back in 15-20 minutes, send out PCR/molecular test results typically come back  within 1-3 days. In the mean time, if you are symptomatic, assume this could be a positive test and treat/monitor yourself as if you do have COVID.   We will call with test results if positive. Please download the MyChart app and set up a profile to access test results.   If symptomatic, go home and rest. Push fluids. Take Tylenol as needed for discomfort. Gargle warm salt water. Throat lozenges. Take Mucinex DM or Robitussin for cough. Humidifier in bedroom to ease coughing. Warm showers. Also review the COVID handout for more information.  COVID-19 INFECTION: The incubation period of COVID-19 is approximately 14 days after exposure, with most symptoms developing in roughly 4-5 days. Symptoms may range in severity from mild to critically severe. Roughly 80% of those infected will have mild symptoms. People of any age may become infected with COVID-19 and have the ability to transmit the virus. The most common symptoms include: fever, fatigue, cough, body aches, headaches, sore throat, nasal congestion, shortness of breath, nausea, vomiting, diarrhea, changes in smell and/or taste.    COURSE OF ILLNESS Some patients may begin with mild disease which can progress quickly into critical symptoms. If your symptoms are worsening please call ahead to the Emergency Department and proceed there for further treatment. Recovery time appears to be roughly 1-2 weeks for mild symptoms and 3-6 weeks for severe disease.   GO IMMEDIATELY TO ER FOR FEVER YOU ARE UNABLE TO GET DOWN WITH TYLENOL, BREATHING PROBLEMS, CHEST PAIN, FATIGUE, LETHARGY, INABILITY TO EAT OR DRINK, ETC  QUARANTINE AND ISOLATION: To help decrease the spread of COVID-19 please remain isolated if you have COVID infection or are highly suspected to have COVID infection. This means -stay home and isolate to one room in the home if you live with others. Do not share a bed or bathroom with others while ill, sanitize and wipe down all countertops and keep  common areas clean and disinfected. Stay home for 5 days. If you have no symptoms or your symptoms are resolving after 5 days, you can leave your house. Continue to wear a mask around others for 5 additional days. If you have been in close contact (within 6 feet) of someone diagnosed with COVID 19, you are advised to quarantine in your home for 14 days as symptoms can develop anywhere from 2-14 days after exposure to the virus. If you develop symptoms, you  must isolate.  Most current guidelines for COVID after exposure -unvaccinated: isolate 5 days and strict mask use x 5 days. Test on day 5 is possible -vaccinated: wear mask x 10 days if symptoms do not develop -You do not necessarily need to be tested for COVID if you have + exposure and  develop symptoms. Just isolate at home x10 days from symptom onset During this global pandemic, CDC advises to practice social distancing, try to stay at least 83ft away from others at all times. Wear a face covering. Wash and sanitize your hands regularly and avoid going anywhere that is not necessary.  KEEP IN MIND THAT THE COVID TEST IS NOT 100% ACCURATE AND YOU SHOULD STILL DO EVERYTHING TO PREVENT POTENTIAL SPREAD OF VIRUS TO OTHERS (WEAR MASK, WEAR GLOVES, WASH HANDS AND SANITIZE REGULARLY). IF INITIAL TEST IS NEGATIVE, THIS MAY NOT MEAN YOU ARE DEFINITELY  NEGATIVE. MOST ACCURATE TESTING IS DONE 5-7 DAYS AFTER EXPOSURE.   It is not advised by CDC to get re-tested after receiving a positive COVID test since you can still test positive for weeks to months after you have already cleared the virus.   *If you have not been vaccinated for COVID, I strongly suggest you consider getting vaccinated as long as there are no contraindications.      ED Prescriptions    Medication Sig Dispense Auth. Provider   azithromycin (ZITHROMAX) 250 MG tablet Take 1 tablet (250 mg total) by mouth daily. Take first 2 tablets together, then 1 every day until finished. 6 tablet  Eusebio Friendly B, PA-C   benzonatate (TESSALON) 200 MG capsule Take 1 capsule (200 mg total) by mouth 3 (three) times daily as needed for up to 7 days for cough. 20 capsule Shirlee Latch, PA-C     PDMP not reviewed this encounter.   Shirlee Latch, PA-C 03/15/21 2022

## 2021-03-16 LAB — SARS CORONAVIRUS 2 (TAT 6-24 HRS): SARS Coronavirus 2: NEGATIVE

## 2022-02-19 IMAGING — CT CT CERVICAL SPINE W/O CM
3 of 4 series · 13 of 33 positions shown, 16 images · non-contrast
Comparison: 07/24/2014

CLINICAL DATA: Neck pain following possible syncopal event

EXAM:
CT CERVICAL SPINE WITHOUT CONTRAST
TECHNIQUE: Multidetector CT imaging of the cervical spine was performed without
intravenous contrast. Multiplanar CT image reconstructions were also
generated.

[Series 4: sagittal bone · sagittal · 0.23mm/px · 5 of 63 slices shown, 6 images]
[im 21/63  bone]
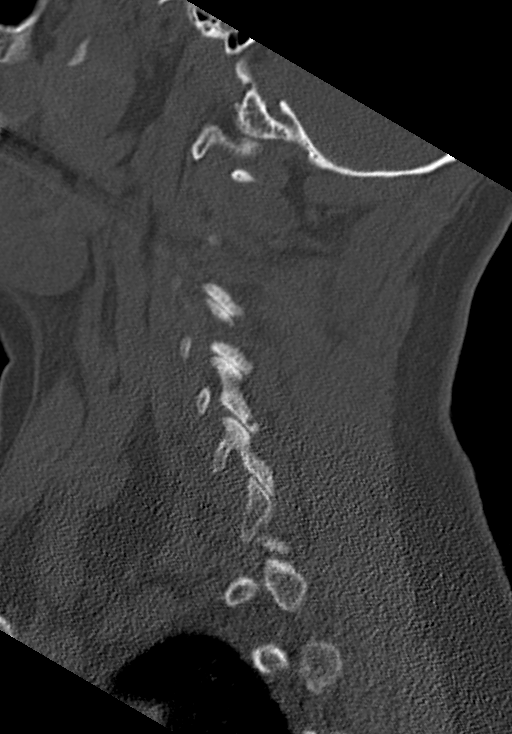
[im 26/63  bone]
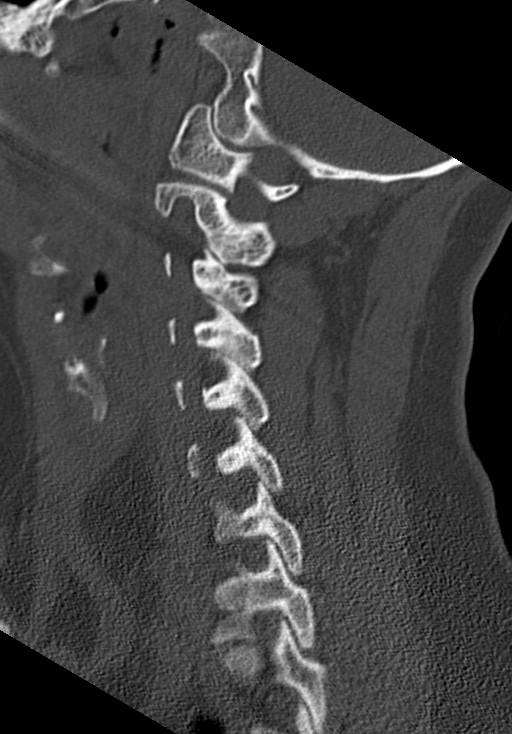
[im 32/63  soft-tissue]
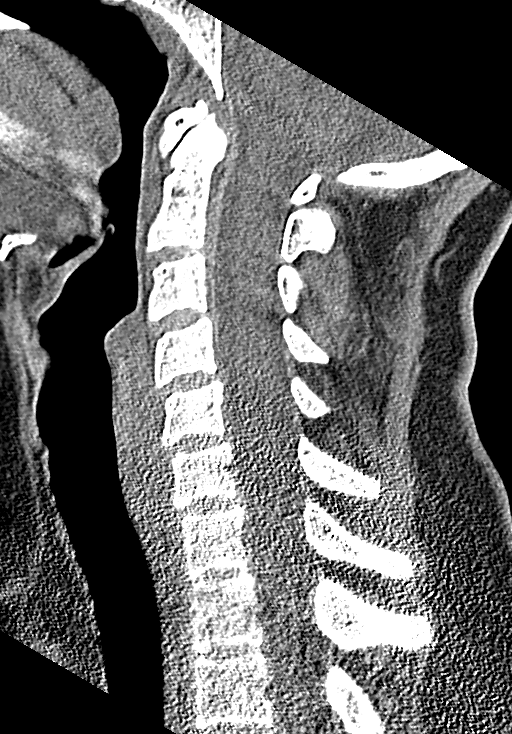
[im 32/63  bone]
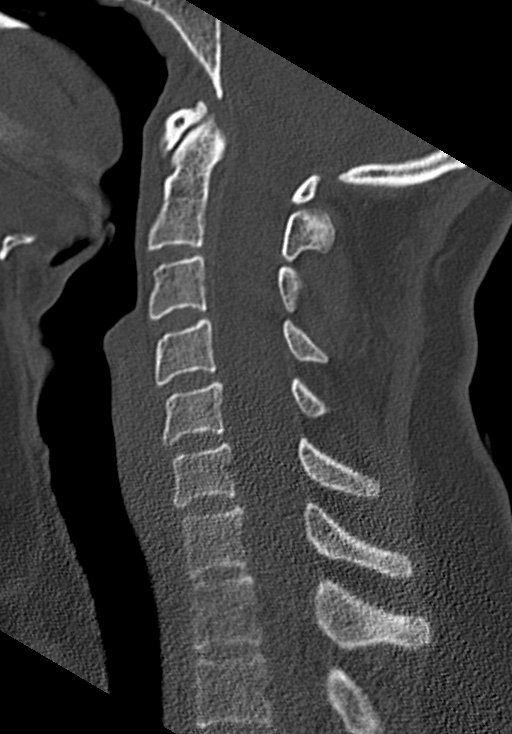
[im 37/63  bone]
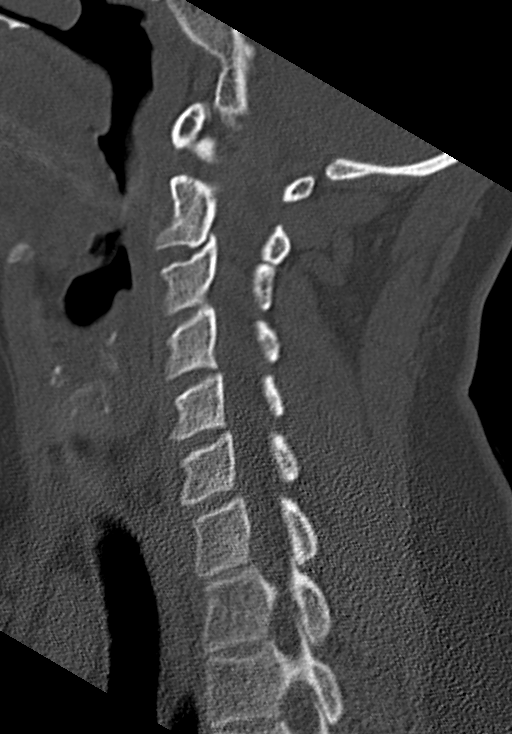
[im 42/63  bone]
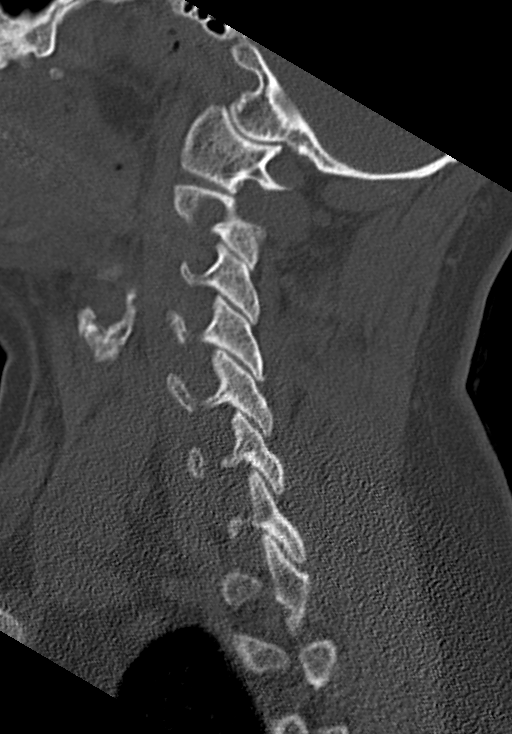

[Series 5: coronal bone · coronal · 0.25mm/px · 3 of 61 slices shown]
[im 16/61  bone]
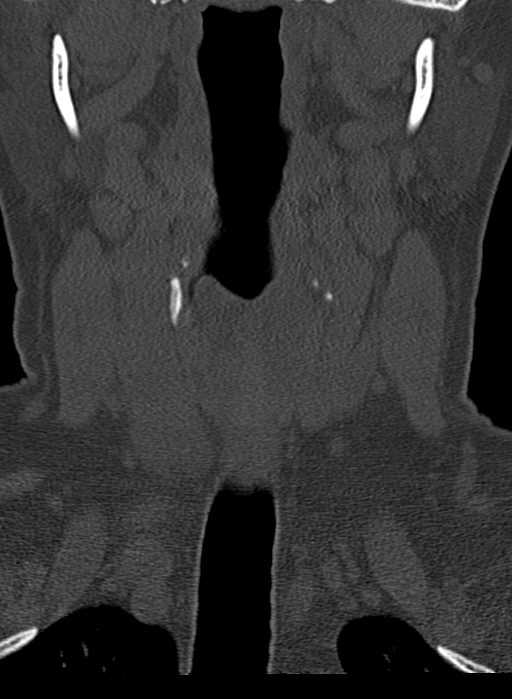
[im 26/61  bone]
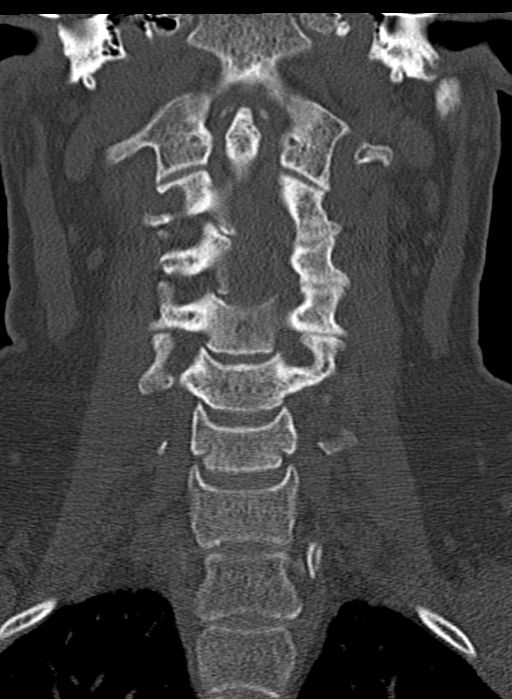
[im 35/61  bone]
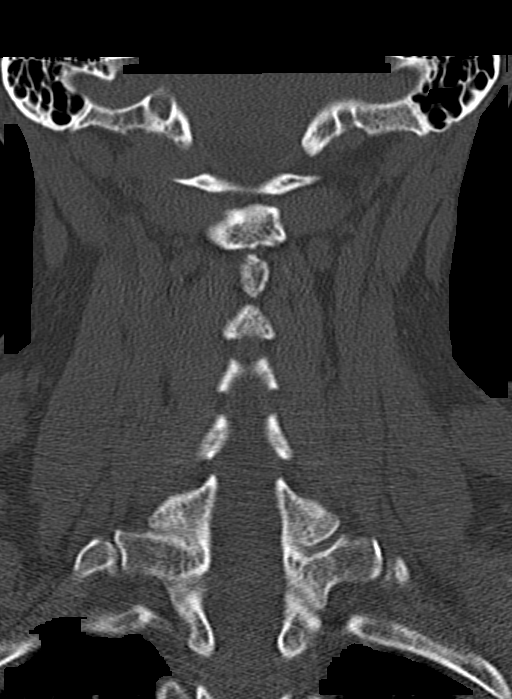

[Series 6: orthogonal bone · axial · 0.23mm/px · z∈[-274,-169]mm · 5 of 87 slices shown, 7 images]
[im 13/87  soft-tissue]
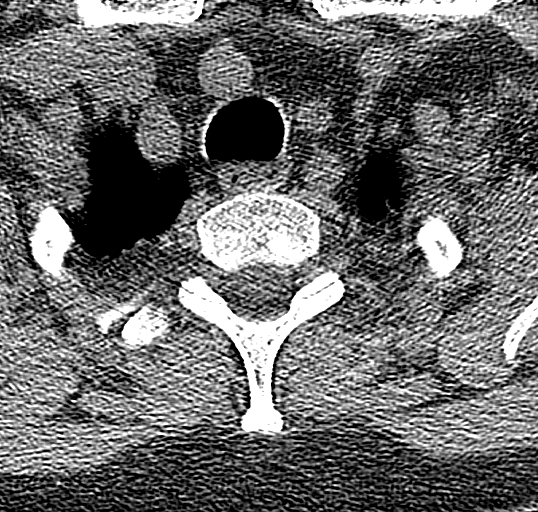
[im 13/87  bone]
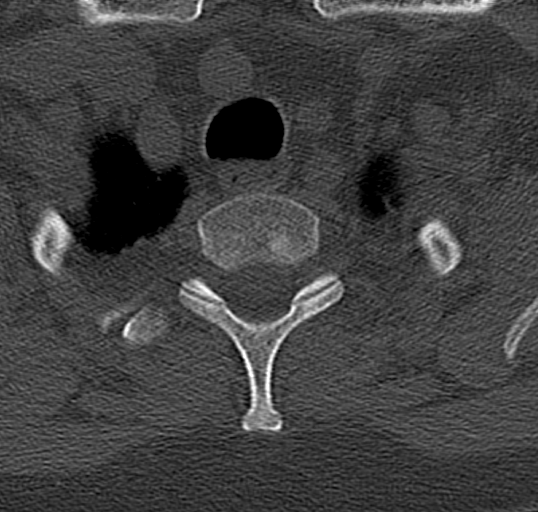
[im 25/87  bone]
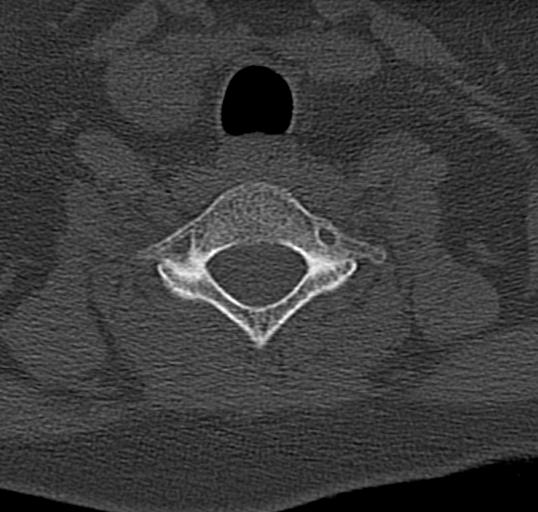
[im 50/87  bone]
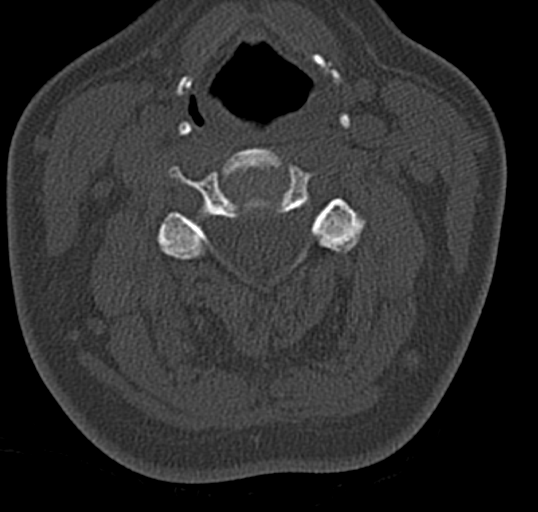
[im 62/87  bone]
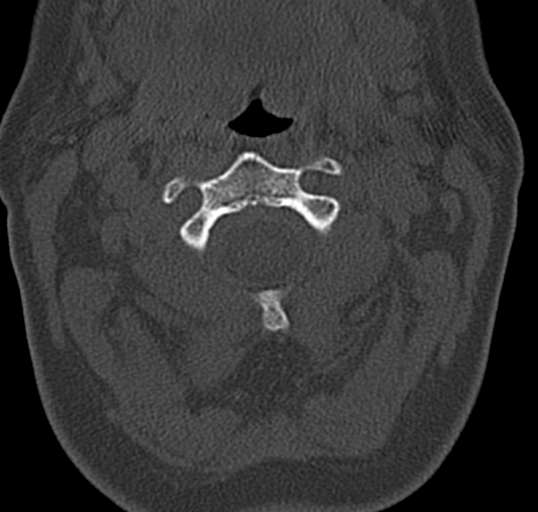
[im 74/87  soft-tissue]
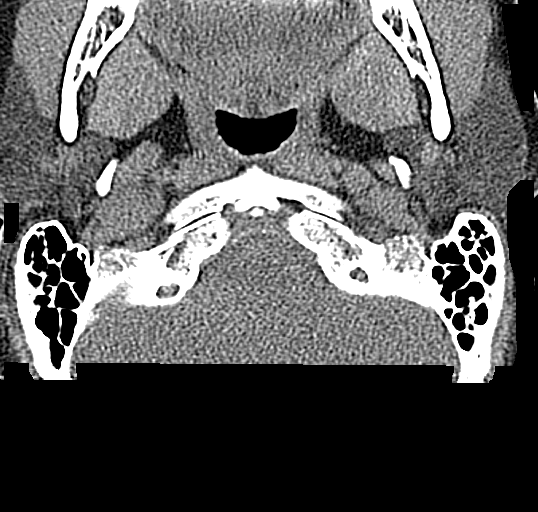
[im 74/87  bone]
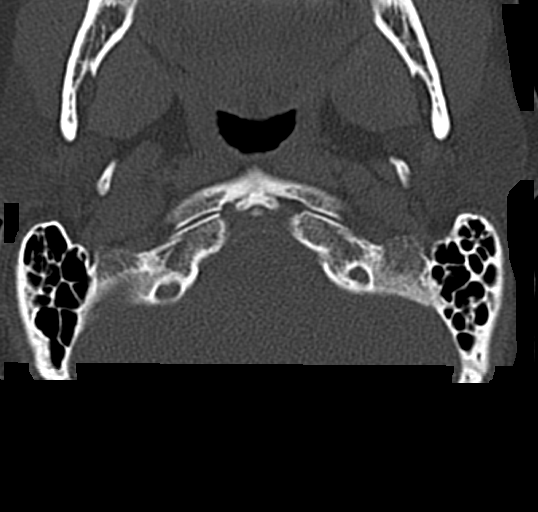

[13 of 33 positions shown; findings below may reference images not displayed]

FINDINGS: Alignment: Normal.

Skull base and vertebrae: 7 cervical segments are well visualized.
Vertebral body height is well maintained. No acute fracture or acute
facet abnormality is noted.

Soft tissues and spinal canal: Surrounding soft tissue structures
are within normal limits.

Upper chest: Within normal limits.

Other: None
IMPRESSION: No acute abnormality in the cervical spine.
